# Patient Record
Sex: Male | Born: 1955 | Race: White | Hispanic: No | Marital: Single
Health system: Southern US, Community
[De-identification: ages and names within clinical notes are randomized; demographics above are authoritative.]

## PROBLEM LIST (undated history)

## (undated) DIAGNOSIS — E876 Hypokalemia: Secondary | ICD-10-CM

## (undated) DIAGNOSIS — G4733 Obstructive sleep apnea (adult) (pediatric): Secondary | ICD-10-CM

## (undated) DIAGNOSIS — I509 Heart failure, unspecified: Secondary | ICD-10-CM

## (undated) DIAGNOSIS — S72009A Fracture of unspecified part of neck of unspecified femur, initial encounter for closed fracture: Secondary | ICD-10-CM

## (undated) DIAGNOSIS — I4891 Unspecified atrial fibrillation: Secondary | ICD-10-CM

## (undated) DIAGNOSIS — E669 Obesity, unspecified: Secondary | ICD-10-CM

## (undated) DIAGNOSIS — E119 Type 2 diabetes mellitus without complications: Secondary | ICD-10-CM

## (undated) DIAGNOSIS — I10 Essential (primary) hypertension: Secondary | ICD-10-CM

## (undated) DIAGNOSIS — E785 Hyperlipidemia, unspecified: Secondary | ICD-10-CM

## (undated) DIAGNOSIS — K5900 Constipation, unspecified: Secondary | ICD-10-CM

## (undated) DIAGNOSIS — J9610 Chronic respiratory failure, unspecified whether with hypoxia or hypercapnia: Secondary | ICD-10-CM

## (undated) DIAGNOSIS — R569 Unspecified convulsions: Secondary | ICD-10-CM

## (undated) DIAGNOSIS — F308 Other manic episodes: Secondary | ICD-10-CM

## (undated) DIAGNOSIS — R609 Edema, unspecified: Secondary | ICD-10-CM

## (undated) DIAGNOSIS — F79 Unspecified intellectual disabilities: Secondary | ICD-10-CM

## (undated) DIAGNOSIS — K219 Gastro-esophageal reflux disease without esophagitis: Secondary | ICD-10-CM

## (undated) HISTORY — DX: Unspecified atrial fibrillation: I48.91

## (undated) HISTORY — DX: Gastro-esophageal reflux disease without esophagitis: K21.9

## (undated) HISTORY — DX: Hypokalemia: E87.6

## (undated) HISTORY — DX: Obstructive sleep apnea (adult) (pediatric): G47.33

## (undated) HISTORY — DX: Other manic episodes: F30.8

## (undated) HISTORY — DX: Chronic respiratory failure, unspecified whether with hypoxia or hypercapnia: J96.10

## (undated) HISTORY — DX: Essential (primary) hypertension: I10

## (undated) HISTORY — DX: Fracture of unspecified part of neck of unspecified femur, initial encounter for closed fracture: S72.009A

## (undated) HISTORY — DX: Hyperlipidemia, unspecified: E78.5

## (undated) HISTORY — DX: Heart failure, unspecified: I50.9

## (undated) HISTORY — DX: Obesity, unspecified: E66.9

## (undated) HISTORY — DX: Type 2 diabetes mellitus without complications: E11.9

## (undated) HISTORY — DX: Unspecified intellectual disabilities: F79

## (undated) HISTORY — DX: Constipation, unspecified: K59.00

## (undated) HISTORY — DX: Edema, unspecified: R60.9

## (undated) HISTORY — DX: Unspecified convulsions: R56.9

---

## 1998-04-08 ENCOUNTER — Emergency Department (HOSPITAL_COMMUNITY): Admission: EM | Admit: 1998-04-08 | Discharge: 1998-04-08 | Payer: Self-pay | Admitting: Emergency Medicine

## 1998-06-01 ENCOUNTER — Ambulatory Visit (HOSPITAL_COMMUNITY): Admission: RE | Admit: 1998-06-01 | Discharge: 1998-06-01 | Payer: Self-pay | Admitting: Gastroenterology

## 1998-10-01 ENCOUNTER — Emergency Department (HOSPITAL_COMMUNITY): Admission: EM | Admit: 1998-10-01 | Discharge: 1998-10-01 | Payer: Self-pay | Admitting: Emergency Medicine

## 1998-11-30 ENCOUNTER — Encounter: Payer: Self-pay | Admitting: Emergency Medicine

## 1998-11-30 ENCOUNTER — Emergency Department (HOSPITAL_COMMUNITY): Admission: EM | Admit: 1998-11-30 | Discharge: 1998-11-30 | Payer: Self-pay | Admitting: Emergency Medicine

## 1998-12-02 ENCOUNTER — Inpatient Hospital Stay (HOSPITAL_COMMUNITY): Admission: EM | Admit: 1998-12-02 | Discharge: 1998-12-08 | Payer: Self-pay | Admitting: Emergency Medicine

## 1998-12-02 ENCOUNTER — Encounter: Payer: Self-pay | Admitting: Emergency Medicine

## 1998-12-03 ENCOUNTER — Encounter: Payer: Self-pay | Admitting: Orthopedic Surgery

## 2004-02-07 ENCOUNTER — Emergency Department (HOSPITAL_COMMUNITY): Admission: EM | Admit: 2004-02-07 | Discharge: 2004-02-07 | Payer: Self-pay | Admitting: Emergency Medicine

## 2005-06-13 ENCOUNTER — Inpatient Hospital Stay (HOSPITAL_COMMUNITY): Admission: EM | Admit: 2005-06-13 | Discharge: 2005-06-23 | Payer: Self-pay | Admitting: Emergency Medicine

## 2005-06-13 ENCOUNTER — Ambulatory Visit: Payer: Self-pay | Admitting: Internal Medicine

## 2005-06-19 ENCOUNTER — Ambulatory Visit: Payer: Self-pay | Admitting: Pulmonary Disease

## 2013-01-07 ENCOUNTER — Non-Acute Institutional Stay (SKILLED_NURSING_FACILITY): Payer: Medicare Other | Admitting: Internal Medicine

## 2013-01-07 DIAGNOSIS — I4891 Unspecified atrial fibrillation: Secondary | ICD-10-CM

## 2013-01-07 DIAGNOSIS — E119 Type 2 diabetes mellitus without complications: Secondary | ICD-10-CM

## 2013-01-07 DIAGNOSIS — E870 Hyperosmolality and hypernatremia: Secondary | ICD-10-CM

## 2013-01-07 DIAGNOSIS — I509 Heart failure, unspecified: Secondary | ICD-10-CM

## 2013-01-08 NOTE — Progress Notes (Signed)
PROGRESS NOTE  DATE: 01-07-13  FACILITY: [facility]  LEVEL OF CARE: SNF  Routine Visit  CHIEF COMPLAINT:  Manage hypernatremia, diabetes mellitus and atrial fibrillation  HISTORY OF PRESENT ILLNESS:  REASSESSMENT OF ONGOING PROBLEM(S):  1. Hypernatremia-on 3/18 sodium 128, 3/28 sodium 129. His Lasix was discontinued. 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 Staff do not report any behavioral changes.  2.DM:pt's DM remains stable.  staff denies polyuria, polydipsia, polyphagia, changes in vision or hypoglycemic episodes.  No complications noted from the medication presently being used.  Last hemoglobin A1c is: hemoglobin A1c 6.2 in 11/13.  3. ATRIAL FIBRILLATION: the patients a-fib remains stable.  The staff denies DOE, tachycardia, orthopnea, transient neurological sx, pedal edema, palpitations, & PNDs.  No complications noted from the medications currently being used.  PAST MEDICAL HISTORY : Reviewed.  No changes.  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  PHYSICAL EXAMINATION  VS:  T 98.3       P 86      RR 18     BP 138/58     POX % 95     WT (Lb)  GENERAL: no acute distress, moderately obese body habitus EYES: conjunctivae normal, sclerae normal, normal eye lids NECK: supple, trachea midline, no neck masses, no thyroid tenderness, no thyromegaly LYMPHATICS: no LAN in the neck, no supraclavicular LAN RESPIRATORY: breathing is even & unlabored, BS CTAB CARDIAC: RRR, no murmur,no extra heart sounds, no edema GI: abdomen soft, normal BS, no masses, no tenderness, no hepatomegaly, no splenomegaly PSYCHIATRIC: the patient is alert and is disoriented, affect & behavior appropriate  LABS/RADIOLOGY: 3/14 sodium 129, glucose 145 otherwise BMP normal, platelets 149 otherwise CBC normal, Tegretol 11.1 11/13 CMP normal, fasting lipid panel normal  ASSESSMENT/PLAN:  1. Hypernatremia-new problem.  Reassess. 2. diabetes mellitus-well controlled. 3. atrial fibrillation-rate controlled. 4. CHF-well compensated. 5. seizure disorder-well-controlled. 6. anxiety-stable.  CPT CODE: 56213

## 2013-02-26 ENCOUNTER — Non-Acute Institutional Stay (SKILLED_NURSING_FACILITY): Payer: Medicare Other | Admitting: Internal Medicine

## 2013-02-26 DIAGNOSIS — E119 Type 2 diabetes mellitus without complications: Secondary | ICD-10-CM

## 2013-02-26 DIAGNOSIS — R569 Unspecified convulsions: Secondary | ICD-10-CM

## 2013-02-26 DIAGNOSIS — I4891 Unspecified atrial fibrillation: Secondary | ICD-10-CM

## 2013-02-26 DIAGNOSIS — I509 Heart failure, unspecified: Secondary | ICD-10-CM

## 2013-03-01 DIAGNOSIS — R569 Unspecified convulsions: Secondary | ICD-10-CM | POA: Insufficient documentation

## 2013-03-01 DIAGNOSIS — E119 Type 2 diabetes mellitus without complications: Secondary | ICD-10-CM | POA: Insufficient documentation

## 2013-03-01 DIAGNOSIS — I4891 Unspecified atrial fibrillation: Secondary | ICD-10-CM | POA: Insufficient documentation

## 2013-03-01 DIAGNOSIS — I509 Heart failure, unspecified: Secondary | ICD-10-CM | POA: Insufficient documentation

## 2013-03-01 NOTE — Progress Notes (Signed)
PROGRESS NOTE  DATE: 02-26-13  FACILITY: [facility]  LEVEL OF CARE: SNF  Routine Visit  CHIEF COMPLAINT:  Manage diabetes mellitus and atrial fibrillation  HISTORY OF PRESENT ILLNESS:  REASSESSMENT OF ONGOING PROBLEM(S):  1. DM:pt's DM remains stable.  staff deny polyuria, polydipsia, polyphagia, changes in vision or hypoglycemic episodes.  No complications noted from the medication presently being used.  Last hemoglobin A1c is: hemoglobin A1c 6.2 in 11/13.  2.  ATRIAL FIBRILLATION: the patients a-fib remains stable.  The staff deny DOE, tachycardia, orthopnea, transient neurological sx, pedal edema, palpitations, & PNDs.  No complications noted from the medications currently being used.  PAST MEDICAL HISTORY : Reviewed.  No changes.  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  PHYSICAL EXAMINATION  VS:  T 97.7      P 78      RR 18     BP 130/70     POX % 99     WT (Lb)  GENERAL: no acute distress, moderately obese body habitus NECK: supple, trachea midline, no neck masses, no thyroid tenderness, no thyromegaly RESPIRATORY: breathing is even & unlabored, BS CTAB CARDIAC: RRR, no murmur,no extra heart sounds, no edema GI: abdomen soft, normal BS, no masses, no tenderness, no hepatomegaly, no splenomegaly PSYCHIATRIC: the patient is alert and is disoriented, affect & behavior appropriate  LABS/RADIOLOGY: 3/14 sodium 129, glucose 145 otherwise BMP normal, platelets 149 otherwise CBC normal, Tegretol 11.1 11/13 CMP normal, fasting lipid panel normal  ASSESSMENT/PLAN:  1. diabetes mellitus-well controlled.  Check HbA1c. 2. atrial fibrillation-rate controlled. 3. CHF-well compensated. 4. seizure disorder-well-controlled. 5. anxiety-stable.  CPT CODE: 30865

## 2013-04-16 ENCOUNTER — Non-Acute Institutional Stay (SKILLED_NURSING_FACILITY): Payer: Medicare Other | Admitting: Adult Health

## 2013-04-16 DIAGNOSIS — I509 Heart failure, unspecified: Secondary | ICD-10-CM

## 2013-04-16 DIAGNOSIS — I10 Essential (primary) hypertension: Secondary | ICD-10-CM

## 2013-04-16 DIAGNOSIS — K5900 Constipation, unspecified: Secondary | ICD-10-CM

## 2013-04-16 DIAGNOSIS — K219 Gastro-esophageal reflux disease without esophagitis: Secondary | ICD-10-CM

## 2013-04-16 DIAGNOSIS — F419 Anxiety disorder, unspecified: Secondary | ICD-10-CM

## 2013-04-16 DIAGNOSIS — R569 Unspecified convulsions: Secondary | ICD-10-CM

## 2013-04-16 DIAGNOSIS — F411 Generalized anxiety disorder: Secondary | ICD-10-CM

## 2013-04-16 DIAGNOSIS — I4891 Unspecified atrial fibrillation: Secondary | ICD-10-CM

## 2013-04-16 DIAGNOSIS — E119 Type 2 diabetes mellitus without complications: Secondary | ICD-10-CM

## 2013-04-24 ENCOUNTER — Encounter: Payer: Self-pay | Admitting: Adult Health

## 2013-04-24 DIAGNOSIS — I10 Essential (primary) hypertension: Secondary | ICD-10-CM | POA: Insufficient documentation

## 2013-04-24 DIAGNOSIS — K5900 Constipation, unspecified: Secondary | ICD-10-CM | POA: Insufficient documentation

## 2013-04-24 DIAGNOSIS — R569 Unspecified convulsions: Secondary | ICD-10-CM | POA: Insufficient documentation

## 2013-04-24 DIAGNOSIS — F419 Anxiety disorder, unspecified: Secondary | ICD-10-CM | POA: Insufficient documentation

## 2013-04-24 DIAGNOSIS — K219 Gastro-esophageal reflux disease without esophagitis: Secondary | ICD-10-CM | POA: Insufficient documentation

## 2013-04-24 NOTE — Progress Notes (Signed)
Patient ID: Tony Terry, male   DOB: 12-30-1955, 57 y.o.   MRN: [id number]       PROGRESS NOTE  DATE: 04/16/13  FACILITY: [facility]  LEVEL OF CARE: SNF  Routine Visit  CHIEF COMPLAINT:  Manage diabetes mellitus and atrial fibrillation  HISTORY OF PRESENT ILLNESS:   REASSESSMENT OF ONGOING PROBLEM(S):  1. DM:pt's DM remains stable.  staff denies polyuria, polydipsia, polyphagia, nor changes in vision.  Noted CBGs in a.m. to be less than 100.  Last hemoglobin A1c is:  6.2 in 11/13.  2.  ATRIAL FIBRILLATION: the patients a-fib remains stable.  The staff deny DOE, tachycardia, orthopnea, transient neurological sx, pedal edema, palpitations, & PNDs.  No complications noted from the medications currently being used.  PAST MEDICAL HISTORY : Reviewed.  No changes.  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  PHYSICAL EXAMINATION  VS:  T 97.8      P 78      RR 20     BP 137/77          WT (Lb) 260.8 pounds  GENERAL: no acute distress, moderately obese body habitus NECK: supple, trachea midline, no neck masses, no thyroid tenderness, no thyromegaly RESPIRATORY: breathing is even & unlabored, BS CTAB CARDIAC: RRR, no murmur,no extra heart sounds, no edema GI: abdomen soft, normal BS, no masses, no tenderness, no hepatomegaly, no splenomegaly PSYCHIATRIC: the patient is alert and is disoriented, affect & behavior appropriate  LABS/RADIOLOGY: 03/25/13 sodium 137 potassium 4.4 glucose 95 BUN 36 creatinine 1.60 total protein 6.3 albumin 2.5 calcium 9.0 03/05/13 sodium 1:30 potassium 4.5 glucose 78 BUN 7 creatinine 0.4 total protein 7.2 albumin 3.3 culture 8.9 liver profile normal hemoglobin A1c 6.3 Tegretol 9.4 primidone 6 02/27/13 liver profile normal except albumin 2.4 3/14 sodium 129, glucose 145 otherwise BMP normal, platelets 149 otherwise CBC normal,  Tegretol 11.1 11/13 CMP normal, fasting lipid panel  normal  ASSESSMENT/PLAN:  Seizures - stable  Constipation - stable  GERD (gastroesophageal reflux disease) - stable  Anxiety - stable  Hypertension - well controlled  Type II or unspecified type diabetes mellitus without mention of complication, not stated as uncontrolled Decrease Lantus to 70 units subcutaneous each bedtime  Atrial fibrillation - rate controlled  Congestive heart failure, unspecified  - stable   CPT CODE: 57846

## 2013-05-28 ENCOUNTER — Non-Acute Institutional Stay (SKILLED_NURSING_FACILITY): Payer: Medicare Other | Admitting: Internal Medicine

## 2013-05-28 DIAGNOSIS — E119 Type 2 diabetes mellitus without complications: Secondary | ICD-10-CM

## 2013-05-28 DIAGNOSIS — R569 Unspecified convulsions: Secondary | ICD-10-CM

## 2013-05-28 DIAGNOSIS — I509 Heart failure, unspecified: Secondary | ICD-10-CM

## 2013-05-28 DIAGNOSIS — I4891 Unspecified atrial fibrillation: Secondary | ICD-10-CM

## 2013-05-30 NOTE — Progress Notes (Signed)
PROGRESS NOTE  DATE: 05-28-13  FACILITY: [facility]  LEVEL OF CARE: SNF  Routine Visit  CHIEF COMPLAINT:  Manage diabetes mellitus and atrial fibrillation  HISTORY OF PRESENT ILLNESS:  REASSESSMENT OF ONGOING PROBLEM(S):  DM:pt's DM remains stable.  staff deny polyuria, polydipsia, polyphagia, changes in vision or hypoglycemic episodes.  No complications noted from the medication presently being used.  Last hemoglobin A1c is: hemoglobin A1c 6.2 in 11/13, in  8-14 hemoglobin A1c 6.2  ATRIAL FIBRILLATION: the patients a-fib remains stable.  The staff deny DOE, tachycardia, orthopnea, transient neurological sx, pedal edema, palpitations, & PNDs.  No complications noted from the medications currently being used.  PAST MEDICAL HISTORY : Reviewed.  No changes.  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  PHYSICAL EXAMINATION  VS:  T 98.3      P 63      RR 20     BP 116/67     POX % 99     WT (Lb)  GENERAL: no acute distress, moderately obese body habitus NECK: supple, trachea midline, no neck masses, no thyroid tenderness, no thyromegaly RESPIRATORY: breathing is even & unlabored, BS CTAB CARDIAC: RRR, no murmur,no extra heart sounds, no edema GI: abdomen soft, normal BS, no masses, no tenderness, no hepatomegaly, no splenomegaly PSYCHIATRIC: the patient is alert and is disoriented, affect & behavior appropriate  LABS/RADIOLOGY:  6/14 BUN 36, creatinine 1.6 otherwise CMP normal 5/14 albumin 3.4 otherwise liver profile normal, fasting lipid panel normal, Tegretol level 9.4, phenobarbital level 7.1 3/14 sodium 129, glucose 145 otherwise BMP normal, platelets 149 otherwise CBC normal, Tegretol 11.1 11/13 CMP normal, fasting lipid panel normal  ASSESSMENT/PLAN:  diabetes mellitus-well controlled. Lantus was decreased atrial fibrillation-rate controlled. CHF-well compensated. seizure  disorder-well-controlled. anxiety-stable.  CPT CODE: [id number]

## 2013-06-16 ENCOUNTER — Other Ambulatory Visit: Payer: Self-pay | Admitting: Internal Medicine

## 2013-06-16 ENCOUNTER — Ambulatory Visit (HOSPITAL_COMMUNITY): Payer: Medicare Other

## 2013-06-16 DIAGNOSIS — M549 Dorsalgia, unspecified: Secondary | ICD-10-CM

## 2013-06-17 ENCOUNTER — Ambulatory Visit (HOSPITAL_COMMUNITY)
Admission: RE | Admit: 2013-06-17 | Discharge: 2013-06-17 | Disposition: A | Discharge status: Home / Self Care | Payer: Medicare Other | Source: Ambulatory Visit | Attending: Internal Medicine | Admitting: Internal Medicine

## 2013-06-17 ENCOUNTER — Non-Acute Institutional Stay (SKILLED_NURSING_FACILITY): Payer: Medicare Other | Admitting: Adult Health

## 2013-06-17 DIAGNOSIS — R52 Pain, unspecified: Secondary | ICD-10-CM

## 2013-06-17 DIAGNOSIS — F79 Unspecified intellectual disabilities: Secondary | ICD-10-CM

## 2013-06-17 DIAGNOSIS — R51 Headache: Secondary | ICD-10-CM | POA: Insufficient documentation

## 2013-06-18 ENCOUNTER — Other Ambulatory Visit: Payer: Self-pay | Admitting: Internal Medicine

## 2013-06-18 DIAGNOSIS — M549 Dorsalgia, unspecified: Secondary | ICD-10-CM

## 2013-06-18 DIAGNOSIS — M542 Cervicalgia: Secondary | ICD-10-CM

## 2013-06-19 ENCOUNTER — Ambulatory Visit (HOSPITAL_COMMUNITY)
Admission: RE | Admit: 2013-06-19 | Discharge: 2013-06-19 | Disposition: A | Discharge status: Home / Self Care | Payer: Medicare Other | Source: Ambulatory Visit | Attending: Internal Medicine | Admitting: Internal Medicine

## 2013-06-19 ENCOUNTER — Non-Acute Institutional Stay (SKILLED_NURSING_FACILITY): Payer: Medicare Other | Admitting: Internal Medicine

## 2013-06-19 DIAGNOSIS — I4891 Unspecified atrial fibrillation: Secondary | ICD-10-CM

## 2013-06-19 DIAGNOSIS — M549 Dorsalgia, unspecified: Secondary | ICD-10-CM

## 2013-06-19 DIAGNOSIS — S22009A Unspecified fracture of unspecified thoracic vertebra, initial encounter for closed fracture: Secondary | ICD-10-CM | POA: Insufficient documentation

## 2013-06-19 DIAGNOSIS — M545 Low back pain, unspecified: Secondary | ICD-10-CM

## 2013-06-19 DIAGNOSIS — I10 Essential (primary) hypertension: Secondary | ICD-10-CM

## 2013-06-19 DIAGNOSIS — M542 Cervicalgia: Secondary | ICD-10-CM

## 2013-06-19 DIAGNOSIS — E119 Type 2 diabetes mellitus without complications: Secondary | ICD-10-CM

## 2013-06-19 DIAGNOSIS — W19XXXA Unspecified fall, initial encounter: Secondary | ICD-10-CM | POA: Insufficient documentation

## 2013-06-19 NOTE — Progress Notes (Signed)
PROGRESS NOTE  DATE: 06-19-13  FACILITY: [facility]  LEVEL OF CARE: SNF  Routine Visit  CHIEF COMPLAINT:  Manage, HTN, diabetes mellitus and atrial fibrillation  HISTORY OF PRESENT ILLNESS:  REASSESSMENT OF ONGOING PROBLEM(S):  DM:pt's DM remains stable.  staff deny polyuria, polydipsia, polyphagia, changes in vision or hypoglycemic episodes.  No complications noted from the medication presently being used.  Last hemoglobin A1c is: hemoglobin A1c 6.2 in 11/13, in  8-14 hemoglobin A1c 6.2  ATRIAL FIBRILLATION: the patients a-fib remains stable.  The staff deny DOE, tachycardia, orthopnea, transient neurological sx, pedal edema, palpitations, & PNDs.  No complications noted from the medications currently being used.  HTN: Pt 's HTN remains stable.  Denies CP, sob, DOE, pedal edema, headaches, dizziness or visual disturbances.  No complications from the medications currently being used.  Last BP : 144/80, 164/85, 147/82  PAST MEDICAL HISTORY : Reviewed.  No changes.  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  PHYSICAL EXAMINATION  VS:  T 97.9      P 76      RR 20     BP 144/80     POX %    WT (Lb)  GENERAL: no acute distress, moderately obese body habitus EYES: Normal sclerae, normal conjunctivae, no discharge NECK: supple, trachea midline, no neck masses, no thyroid tenderness, no thyromegaly LYMPHATICS: No cervical lymphadenopathy, no supraclavicular lymphadenopathy RESPIRATORY: breathing is even & unlabored, BS CTAB CARDIAC: RRR, no murmur,no extra heart sounds, no edema GI: abdomen soft, normal BS, no masses, no tenderness, no hepatomegaly, no splenomegaly PSYCHIATRIC: the patient is alert and is disoriented, affect & behavior appropriate  LABS/RADIOLOGY:  05-31-13 LS-spine x-ray negative  6/14 BUN 36, creatinine 1.6 otherwise CMP normal 5/14 albumin 3.4 otherwise liver profile normal, fasting lipid  panel normal, Tegretol level 9.4, phenobarbital level 7.1 3/14 sodium 129, glucose 145 otherwise BMP normal, platelets 149 otherwise CBC normal, Tegretol 11.1 11/13 CMP normal, fasting lipid panel normal  ASSESSMENT/PLAN:  Hypertension-uncontrolled. Increase Lopressor to 75 mg twice a day. diabetes mellitus-well controlled. Lantus was decreased atrial fibrillation-rate controlled. Low back pain-Percocet was started. CT of the lumbar spine is pending CHF-well compensated. seizure disorder-well-controlled. anxiety-stable.  CPT CODE: [id number]

## 2013-06-20 ENCOUNTER — Encounter: Payer: Self-pay | Admitting: Adult Health

## 2013-06-20 DIAGNOSIS — F79 Unspecified intellectual disabilities: Secondary | ICD-10-CM | POA: Insufficient documentation

## 2013-06-20 DIAGNOSIS — R52 Pain, unspecified: Secondary | ICD-10-CM | POA: Insufficient documentation

## 2013-06-20 NOTE — Progress Notes (Signed)
Patient ID: Tony Terry, male   DOB: 1955-10-25, 57 y.o.   MRN: [id number]       PROGRESS NOTE  DATE: 06/17/2013  FACILITY:  [facility]  LEVEL OF CARE: SNF (31)  Acute Visit  CHIEF COMPLAINT:  Manage Pain on neck and lower back  HISTORY OF PRESENT ILLNESS: This is a 57 year old male who had a fall previously and now complaining of pain on his neck and lower back. No bruises noted x-ray of spine shows no fracture. Patient is unable to sit up for a long period of time.  PAST MEDICAL HISTORY : Reviewed.  No changes.  CURRENT MEDICATIONS: Reviewed per [facility]  REVIEW OF SYSTEMS:  GENERAL: no change in appetite, no fatigue, no weight changes, no fever, chills or weakness RESPIRATORY: no cough, SOB, DOE,, wheezing, hemoptysis CARDIAC: no chest pain, edema or palpitations GI: no abdominal pain, diarrhea, constipation, heart burn, nausea or vomiting  PHYSICAL EXAMINATION  VS:  T 97        P 74       RR 18       BP 126/78           WT 255 (Lb)  GENERAL: no acute distress, normal body habitus EYES: conjunctivae normal, sclerae normal, normal eye lids NECK: supple, trachea midline, no neck masses, no thyroid tenderness, no thyromegaly LYMPHATICS: no LAN in the neck, no supraclavicular LAN RESPIRATORY: breathing is even & unlabored, BS CTAB CARDIAC: RRR, no murmur,no extra heart sounds, no edema GI: abdomen soft, normal BS, no masses, no tenderness, no hepatomegaly, no splenomegaly PSYCHIATRIC: the patient is alert & oriented to person, affect & behavior appropriate  LABS/RADIOLOGY: 6/14 BUN 36, creatinine 1.6 otherwise CMP normal 5/14 albumin 3.4 otherwise liver profile normal, fasting lipid panel normal, Tegretol level 9.4, phenobarbital level 7.1 3/14 sodium 129, glucose 145 otherwise BMP normal, platelets 149 otherwise CBC normal, Tegretol 11.1 11/13 CMP normal, fasting lipid panel normal   ASSESSMENT/PLAN:  Intellect Disability - continue supportive care  Pain  - CT scan of spine, cervical, thoracic and sacral  CPT CODE: 40981

## 2013-07-30 ENCOUNTER — Non-Acute Institutional Stay (SKILLED_NURSING_FACILITY): Payer: Medicare Other | Admitting: Adult Health

## 2013-07-30 DIAGNOSIS — K219 Gastro-esophageal reflux disease without esophagitis: Secondary | ICD-10-CM

## 2013-07-30 DIAGNOSIS — F411 Generalized anxiety disorder: Secondary | ICD-10-CM

## 2013-07-30 DIAGNOSIS — F419 Anxiety disorder, unspecified: Secondary | ICD-10-CM

## 2013-07-30 DIAGNOSIS — F79 Unspecified intellectual disabilities: Secondary | ICD-10-CM

## 2013-07-30 DIAGNOSIS — I10 Essential (primary) hypertension: Secondary | ICD-10-CM

## 2013-07-30 DIAGNOSIS — K5900 Constipation, unspecified: Secondary | ICD-10-CM

## 2013-07-30 DIAGNOSIS — E119 Type 2 diabetes mellitus without complications: Secondary | ICD-10-CM

## 2013-07-30 DIAGNOSIS — R569 Unspecified convulsions: Secondary | ICD-10-CM

## 2013-07-30 DIAGNOSIS — I4891 Unspecified atrial fibrillation: Secondary | ICD-10-CM

## 2013-07-30 DIAGNOSIS — I509 Heart failure, unspecified: Secondary | ICD-10-CM

## 2013-07-30 NOTE — Progress Notes (Signed)
Patient ID: Tony Terry, male   DOB: 11-Dec-1955, 57 y.o.   MRN: [id number]       PROGRESS NOTE  DATE: 07/30/13  FACILITY: [facility]  LEVEL OF CARE: SNF (31)   Routine Visit  CHIEF COMPLAINT:  Manage, HTN, CHF,diabetes mellitus and atrial fibrillation  HISTORY OF PRESENT ILLNESS:  REASSESSMENT OF ONGOING PROBLEM(S):  CHF:The patient does not relate significant weight changes, denies sob, DOE, orthopnea, PNDs, pedal edema, palpitations or chest pain.  CHF remains stable.  No complications form the medications being used.  ANXIETY: The anxiety remains stable. Patient denies ongoing anxiety or irritability. No complications reported from the medications currently being used.  HTN: Pt 's HTN remains stable.  Denies CP, sob, DOE, pedal edema, headaches, dizziness or visual disturbances.  No complications from the medications currently being used.  Last BP : 120/78   PAST MEDICAL HISTORY : Reviewed.  No changes.  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  PHYSICAL EXAMINATION  VS:  T 97.9      P 76      RR 20     BP 120/78     POX100 %    WT254.4 (Lb)  GENERAL: no acute distress, moderately obese body habitus NECK: supple, trachea midline, no neck masses, no thyroid tenderness, no thyromegaly RESPIRATORY: breathing is even & unlabored, BS CTAB CARDIAC: RRR, no murmur,no extra heart sounds, no edema GI: abdomen soft, normal BS, no masses, no tenderness, no hepatomegaly, no splenomegaly PSYCHIATRIC: the patient is alert and is disoriented, affect & behavior appropriate  LABS/RADIOLOGY: 9/14 WBC 6.3 hemoglobin 15 hematocrit 47.3  CT thoracic spine shows T12 compression fracture 05-31-13 LS-spine x-ray negative 6/14 BUN 36, creatinine 1.6 otherwise CMP normal 5/14 albumin 3.4 otherwise liver profile normal, fasting lipid panel normal, Tegretol level 9.4, phenobarbital level 7.1 3/14 sodium 129, glucose 145 otherwise  BMP normal, platelets 149 otherwise CBC normal,  Tegretol 11.1 11/13 CMP normal, fasting lipid panel normal  ASSESSMENT/PLAN:  Hypertension-  Well-controlled . diabetes mellitus- well controlled. Continue Lantusatrial fibrillation-rate controlled.  CHF-well compensated.  seizure disorder-well-controlled.  anxiety-stable.  Intellect Disability - continue supportive care   CPT CODE: 40981

## 2013-09-04 ENCOUNTER — Non-Acute Institutional Stay (SKILLED_NURSING_FACILITY): Payer: Medicare Other | Admitting: Adult Health

## 2013-09-04 DIAGNOSIS — F411 Generalized anxiety disorder: Secondary | ICD-10-CM

## 2013-09-04 DIAGNOSIS — F79 Unspecified intellectual disabilities: Secondary | ICD-10-CM

## 2013-09-04 DIAGNOSIS — E119 Type 2 diabetes mellitus without complications: Secondary | ICD-10-CM

## 2013-09-04 DIAGNOSIS — F419 Anxiety disorder, unspecified: Secondary | ICD-10-CM

## 2013-09-04 DIAGNOSIS — K5900 Constipation, unspecified: Secondary | ICD-10-CM

## 2013-09-04 DIAGNOSIS — K219 Gastro-esophageal reflux disease without esophagitis: Secondary | ICD-10-CM

## 2013-09-04 DIAGNOSIS — I4891 Unspecified atrial fibrillation: Secondary | ICD-10-CM

## 2013-09-04 DIAGNOSIS — I509 Heart failure, unspecified: Secondary | ICD-10-CM

## 2013-09-04 DIAGNOSIS — I10 Essential (primary) hypertension: Secondary | ICD-10-CM

## 2013-09-04 DIAGNOSIS — R569 Unspecified convulsions: Secondary | ICD-10-CM

## 2013-09-04 NOTE — Progress Notes (Signed)
Patient ID: Tony Terry, male   DOB: August 01, 1956, 57 y.o.   MRN: [id number]       PROGRESS NOTE  DATE: 09/04/13  FACILITY: [facility]  LEVEL OF CARE: SNF (31)   Routine Visit  CHIEF COMPLAINT:  Manage, HTN, CHF,diabetes mellitus and atrial fibrillation  HISTORY OF PRESENT ILLNESS:  REASSESSMENT OF ONGOING PROBLEM(S):  DM:pt's DM remains stable.  Pt denies polyuria, polydipsia, polyphagia, changes in vision or hypoglycemic episodes.  No complications noted from the medication presently being used. 11/14  hemoglobin A1c is: 5.9  ATRIAL FIBRILLATION: the patients atrial fibrillation remains stable.  The patient denies DOE, tachycardia, orthopnea, transient neurological sx, pedal edema, palpitations, & PNDs.  No complications noted from the medications currently being used.  HTN: Pt 's HTN remains stable.  Denies CP, sob, DOE, pedal edema, headaches, dizziness or visual disturbances.  No complications from the medications currently being used.  Last BP : 120/78  GERD: pt's GERD is stable.  Denies ongoing heartburn, abd. Pain, nausea or vomiting.  Currently on a PPI & tolerates it without any adverse reactions.  PAST MEDICAL HISTORY : Reviewed.  No changes.  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  PHYSICAL EXAMINATION  VS:  T 98.7      P64     RR 20     BP 120/78     POX100 %    WT256.8 (Lb)  GENERAL: no acute distress, moderately obese body habitus EYES:  Normal sclerae, normal conjunctivae, no discharge NECK: supple, trachea midline, no neck masses, no thyroid tenderness, no thyromegaly RESPIRATORY: breathing is even & unlabored, BS CTAB CARDIAC: RRR, no murmur,no extra heart sounds, no edema GI: abdomen soft, normal BS, no masses, no tenderness, no hepatomegaly, no splenomegaly PSYCHIATRIC: the patient is alert and is disoriented, affect & behavior appropriate  LABS/RADIOLOGY: 9/14 WBC 6.3  hemoglobin 15 hematocrit 47.3  CT thoracic spine shows T12 compression fracture 05-31-13 LS-spine x-ray negative 6/14 BUN 36, creatinine 1.6 otherwise CMP normal 5/14 albumin 3.4 otherwise liver profile normal, fasting lipid panel normal, Tegretol level 9.4, phenobarbital level 7.1 3/14 sodium 129, glucose 145 otherwise BMP normal, platelets 149 otherwise CBC normal,  Tegretol 11.1 11/13 CMP normal, fasting lipid panel normal  ASSESSMENT/PLAN:  Hypertension-  Well-controlled . diabetes mellitus- well controlled. Continue Lantusatrial fibrillation-rate controlled.  CHF-well compensated.  seizure disorder-well-controlled.  anxiety-stable.  Intellect Disability - continue supportive care  Constipation - no complaints  GERD - continue Zantac   CPT CODE: 56213

## 2013-10-01 ENCOUNTER — Non-Acute Institutional Stay (SKILLED_NURSING_FACILITY): Payer: Medicare Other | Admitting: Adult Health

## 2013-10-01 DIAGNOSIS — F419 Anxiety disorder, unspecified: Secondary | ICD-10-CM

## 2013-10-01 DIAGNOSIS — E119 Type 2 diabetes mellitus without complications: Secondary | ICD-10-CM

## 2013-10-01 DIAGNOSIS — F411 Generalized anxiety disorder: Secondary | ICD-10-CM

## 2013-10-01 DIAGNOSIS — F79 Unspecified intellectual disabilities: Secondary | ICD-10-CM

## 2013-10-01 DIAGNOSIS — I4891 Unspecified atrial fibrillation: Secondary | ICD-10-CM

## 2013-10-01 DIAGNOSIS — K5900 Constipation, unspecified: Secondary | ICD-10-CM

## 2013-10-01 DIAGNOSIS — I10 Essential (primary) hypertension: Secondary | ICD-10-CM

## 2013-10-01 DIAGNOSIS — I509 Heart failure, unspecified: Secondary | ICD-10-CM

## 2013-10-01 DIAGNOSIS — K219 Gastro-esophageal reflux disease without esophagitis: Secondary | ICD-10-CM

## 2013-10-01 DIAGNOSIS — R569 Unspecified convulsions: Secondary | ICD-10-CM

## 2013-10-01 NOTE — Progress Notes (Signed)
Patient ID: Tony Terry, male   DOB: 01/10/1956, 57 y.o.   MRN: [id number]                 PROGRESS NOTE  DATE: 10/01/13  FACILITY: [facility]  LEVEL OF CARE: SNF (31)   Routine Visit  CHIEF COMPLAINT:  Manage, HTN, CHF,diabetes mellitus and atrial fibrillation  HISTORY OF PRESENT ILLNESS:  REASSESSMENT OF ONGOING PROBLEM(S):  SEIZURE DISORDER: The patient's seizure disorder remains stable. No complications reported from the medications presently being used. Staff do not report any recent seizure activity.  HTN: Pt 's HTN remains stable.  Denies CP, sob, DOE, pedal edema, headaches, dizziness or visual disturbances.  No complications from the medications currently being used.  Last BP : 103/61  CHF:The patient does not relate significant weight changes, denies sob, DOE, orthopnea, PNDs, pedal edema, palpitations or chest pain.  CHF remains stable.  No complications form the medications being used.   PAST MEDICAL HISTORY : Reviewed.  No changes.  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  PHYSICAL EXAMINATION  VS:  T 98.8      P56     RR 20     BP 103/61        WT257.2 (Lb)  GENERAL: no acute distress, moderately obese body habitus NECK: supple, trachea midline, no neck masses, no thyroid tenderness, no thyromegaly RESPIRATORY: breathing is even & unlabored, BS CTAB CARDIAC: RRR, no murmur,no extra heart sounds, no edema GI: abdomen soft, normal BS, no masses, no tenderness, no hepatomegaly, no splenomegaly PSYCHIATRIC: the patient is alert and is disoriented, affect & behavior appropriate  LABS/RADIOLOGY: 09/23/13 sodium 131 potassium 4.1 glucose 84 BUN 8 creatinine 0.5 calcium 9.2 09/03/13 hemoglobin A1c 5.8 cholesterol 139 HDL 43 LDL 81 triglycerides 75 WBC 5.3 hemoglobin 13.8 hematocrit 42.9 sodium 128 potassium 4.1 glucose 163 BUN 9 creatinine 0.5 calcium 8.4 08/25/13 hemoglobin A1c 5.9  sodium 128 potassium 4.2 glucose 118 BUN 8 creatinine 0.5 calcium 9.2 cholesterol 143 HDL 50 LDL 84 triglycerides 44 9/14 WBC 6.3 hemoglobin 15 hematocrit 47.3  CT thoracic spine shows T12 compression fracture 05-31-13 LS-spine x-ray negative 6/14 BUN 36, creatinine 1.6 otherwise CMP normal 5/14 albumin 3.4 otherwise liver profile normal, fasting lipid panel normal, Tegretol level 9.4, phenobarbital level 7.1 3/14 sodium 129, glucose 145 otherwise BMP normal, platelets 149 otherwise CBC normal,  Tegretol 11.1 11/13 CMP normal, fasting lipid panel normal  ASSESSMENT/PLAN:  Hypertension-  Well-controlled; continue diltiazem  . diabetes mellitus- well controlled. Continue Lantus, NovoLog sliding scale and Actos  atrial fibrillation-rate controlled; continue Lopressor  CHF-well compensated.  seizure disorder-well-controlled; continue Tegretol  anxiety-stable; continue BuSpar  Intellect Disability - continue supportive care  Constipation - no complaints; continue senna S.  GERD - continue Zantac   CPT CODE: [id number]

## 2013-11-07 ENCOUNTER — Non-Acute Institutional Stay (SKILLED_NURSING_FACILITY): Payer: Medicare Other | Admitting: Adult Health

## 2013-11-07 DIAGNOSIS — F79 Unspecified intellectual disabilities: Secondary | ICD-10-CM

## 2013-11-07 DIAGNOSIS — K219 Gastro-esophageal reflux disease without esophagitis: Secondary | ICD-10-CM

## 2013-11-07 DIAGNOSIS — K5900 Constipation, unspecified: Secondary | ICD-10-CM

## 2013-11-07 DIAGNOSIS — I4891 Unspecified atrial fibrillation: Secondary | ICD-10-CM

## 2013-11-07 DIAGNOSIS — E119 Type 2 diabetes mellitus without complications: Secondary | ICD-10-CM

## 2013-11-07 DIAGNOSIS — R569 Unspecified convulsions: Secondary | ICD-10-CM

## 2013-11-07 DIAGNOSIS — I509 Heart failure, unspecified: Secondary | ICD-10-CM

## 2013-11-07 DIAGNOSIS — F419 Anxiety disorder, unspecified: Secondary | ICD-10-CM

## 2013-11-07 DIAGNOSIS — I10 Essential (primary) hypertension: Secondary | ICD-10-CM

## 2013-11-07 DIAGNOSIS — F411 Generalized anxiety disorder: Secondary | ICD-10-CM

## 2013-11-08 NOTE — Progress Notes (Signed)
Patient ID: Tony CoriaMark Terry, male   DOB: 04-15-1956, 58 y.o.   MRN: [id number]                  PROGRESS NOTE  DATE: 11/07/13  [facility]  LEVEL OF CARE: SNF (31)   Routine Visit  CHIEF COMPLAINT:  Manage, HTN, CHF,diabetes mellitus and atrial fibrillation  HISTORY OF PRESENT ILLNESS:  REASSESSMENT OF ONGOING PROBLEM(S):  DM:pt's DM remains stable.  Pt denies polyuria, polydipsia, polyphagia, changes in vision or hypoglycemic episodes.  No complications noted from the medication presently being used.  11/14 hemoglobin A1c5.8  HTN: Pt 's HTN remains stable.  Denies CP, sob, DOE, pedal edema, headaches, dizziness or visual disturbances.  No complications from the medications currently being used.  Last BP : 126/67  ATRIAL FIBRILLATION: the patients atrial fibrillation remains stable.  The patient denies DOE, tachycardia, orthopnea, transient neurological sx, pedal edema, palpitations, & PNDs.  No complications noted from the medications currently being used.   PAST MEDICAL HISTORY : Reviewed.  No changes.  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  PHYSICAL EXAMINATION  VS:  T98     P62    RR 20     BP 126/67        WT258.4 (Lb)  GENERAL: no acute distress, moderately obese body habitus NECK: supple, trachea midline, no neck masses, no thyroid tenderness, no thyromegaly LYMPHATICS: No adenopathy in the cervical, supraclavicular, axillary, or inguinal areas RESPIRATORY: breathing is even & unlabored, BS CTAB CARDIAC: RRR, no murmur,no extra heart sounds, no edema GI: abdomen soft, normal BS, no masses, no tenderness, no hepatomegaly, no splenomegaly PSYCHIATRIC: the patient is alert and is disoriented, affect & behavior appropriate  LABS/RADIOLOGY: 09/23/13 sodium 131 potassium 4.1 glucose 84 BUN 8 creatinine 0.5 calcium 9.2 09/03/13 hemoglobin A1c 5.8 cholesterol 139 HDL 43 LDL 81  triglycerides 75 WBC 5.3 hemoglobin 13.8 hematocrit 42.9 sodium 128 potassium 4.1 glucose 163 BUN 9 creatinine 0.5 calcium 8.4 08/25/13 hemoglobin A1c 5.9 sodium 128 potassium 4.2 glucose 118 BUN 8 creatinine 0.5 calcium 9.2 cholesterol 143 HDL 50 LDL 84 triglycerides 44 9/14 WBC 6.3 hemoglobin 15 hematocrit 47.3  CT thoracic spine shows T12 compression fracture 05-31-13 LS-spine x-ray negative 6/14 BUN 36, creatinine 1.6 otherwise CMP normal 5/14 albumin 3.4 otherwise liver profile normal, fasting lipid panel normal, Tegretol level 9.4, phenobarbital level 7.1 3/14 sodium 129, glucose 145 otherwise BMP normal, platelets 149 otherwise CBC normal,  Tegretol 11.1 11/13 CMP normal, fasting lipid panel normal  ASSESSMENT/PLAN:  Hypertension-  Well-controlled; continue diltiazem  . diabetes mellitus- well controlled. Continue Lantus, NovoLog sliding scale and Actos  atrial fibrillation-rate controlled; continue Lopressor  CHF-well compensated.  seizure disorder-well-controlled; continue Tegretol  anxiety-stable; continue BuSpar  Intellect Disability - continue supportive care  Constipation - no complaints; continue senna S.  GERD - continue Zantac   CPT CODE: [id number]

## 2013-12-09 ENCOUNTER — Encounter: Payer: Self-pay | Admitting: *Deleted

## 2013-12-10 ENCOUNTER — Non-Acute Institutional Stay (SKILLED_NURSING_FACILITY): Payer: Medicare Other | Admitting: Adult Health

## 2013-12-10 DIAGNOSIS — R569 Unspecified convulsions: Secondary | ICD-10-CM

## 2013-12-10 DIAGNOSIS — F411 Generalized anxiety disorder: Secondary | ICD-10-CM

## 2013-12-10 DIAGNOSIS — I509 Heart failure, unspecified: Secondary | ICD-10-CM

## 2013-12-10 DIAGNOSIS — K219 Gastro-esophageal reflux disease without esophagitis: Secondary | ICD-10-CM

## 2013-12-10 DIAGNOSIS — E119 Type 2 diabetes mellitus without complications: Secondary | ICD-10-CM

## 2013-12-10 DIAGNOSIS — I10 Essential (primary) hypertension: Secondary | ICD-10-CM

## 2013-12-10 DIAGNOSIS — K5900 Constipation, unspecified: Secondary | ICD-10-CM

## 2013-12-10 DIAGNOSIS — F419 Anxiety disorder, unspecified: Secondary | ICD-10-CM

## 2013-12-10 DIAGNOSIS — I4891 Unspecified atrial fibrillation: Secondary | ICD-10-CM

## 2013-12-10 DIAGNOSIS — F79 Unspecified intellectual disabilities: Secondary | ICD-10-CM

## 2014-01-28 ENCOUNTER — Encounter: Payer: Self-pay | Admitting: Adult Health

## 2014-01-28 ENCOUNTER — Non-Acute Institutional Stay (SKILLED_NURSING_FACILITY): Payer: Medicare Other | Admitting: Adult Health

## 2014-01-28 DIAGNOSIS — I4891 Unspecified atrial fibrillation: Secondary | ICD-10-CM

## 2014-01-28 DIAGNOSIS — F411 Generalized anxiety disorder: Secondary | ICD-10-CM

## 2014-01-28 DIAGNOSIS — K219 Gastro-esophageal reflux disease without esophagitis: Secondary | ICD-10-CM

## 2014-01-28 DIAGNOSIS — F79 Unspecified intellectual disabilities: Secondary | ICD-10-CM

## 2014-01-28 DIAGNOSIS — F419 Anxiety disorder, unspecified: Secondary | ICD-10-CM

## 2014-01-28 DIAGNOSIS — R569 Unspecified convulsions: Secondary | ICD-10-CM

## 2014-01-28 DIAGNOSIS — K5900 Constipation, unspecified: Secondary | ICD-10-CM

## 2014-01-28 DIAGNOSIS — I509 Heart failure, unspecified: Secondary | ICD-10-CM

## 2014-01-28 DIAGNOSIS — I10 Essential (primary) hypertension: Secondary | ICD-10-CM

## 2014-01-28 DIAGNOSIS — E119 Type 2 diabetes mellitus without complications: Secondary | ICD-10-CM

## 2014-01-28 NOTE — Progress Notes (Signed)
Patient ID: Carlene CoriaMark Muhlbauer, male   DOB: [birth year], 58 y.o.   MRN: [id number]         PROGRESS NOTE  DATE: 01/28/14  [facility]  LEVEL OF CARE: SNF (31)   Routine Visit  CHIEF COMPLAINT:  Manage, HTN, CHF,diabetes mellitus and atrial fibrillation  HISTORY OF PRESENT ILLNESS:  REASSESSMENT OF ONGOING PROBLEM(S):  ATRIAL FIBRILLATION: the patients atrial fibrillation remains stable.  The patient denies DOE, tachycardia, orthopnea, transient neurological sx, pedal edema, palpitations, & PNDs.  No complications noted from the medications currently being used.  CHF:The patient does not relate significant weight changes, denies sob, DOE, orthopnea, PNDs, pedal edema, palpitations or chest pain.  CHF remains stable.  No complications form the medications being used.  HTN: Pt 's HTN remains stable.  Denies CP, sob, DOE, pedal edema, headaches, dizziness or visual disturbances.  No complications from the medications currently being used.  Last BP : 111/61  PAST MEDICAL HISTORY : Reviewed.  No changes.  CURRENT MEDICATIONS: [facility]  REVIEW OF SYSTEMS: Unobtainable secondary to mental retardation  PHYSICAL EXAMINATION  GENERAL: no acute distress, moderately obese body habitus NECK: supple, trachea midline, no neck masses, no thyroid tenderness, no thyromegaly RESPIRATORY: breathing is even & unlabored, BS CTAB CARDIAC: RRR, no murmur,no extra heart sounds, no edema GI: abdomen soft, normal BS, no masses, no tenderness, no hepatomegaly, no splenomegaly EXTREMITIES: able to move all 4 extremities ; uses wheelchair PSYCHIATRIC: the patient is alert and is disoriented, affect & behavior appropriate  LABS/RADIOLOGY: 12/04/13 hgbA1c 5.8 09/23/13 sodium 131 potassium 4.1 glucose 84 BUN 8 creatinine 0.5 calcium 9.2 09/03/13 hemoglobin A1c 5.8 cholesterol 139 HDL 43 LDL 81 triglycerides 75 WBC 5.3 hemoglobin 13.8 hematocrit 42.9 sodium 128 potassium 4.1 glucose  163 BUN 9 creatinine 0.5 calcium 8.4 08/25/13 hemoglobin A1c 5.9 sodium 128 potassium 4.2 glucose 118 BUN 8 creatinine 0.5 calcium 9.2 cholesterol 143 HDL 50 LDL 84 triglycerides 44 9/14 WBC 6.3 hemoglobin 15 hematocrit 47.3  CT thoracic spine shows T12 compression fracture 05-31-13 LS-spine x-ray negative 6/14 BUN 36, creatinine 1.6 otherwise CMP normal 5/14 albumin 3.4 otherwise liver profile normal, fasting lipid panel normal, Tegretol level 9.4, phenobarbital level 7.1 3/14 sodium 129, glucose 145 otherwise BMP normal, platelets 149 otherwise CBC normal,  Tegretol 11.1 11/13 CMP normal, fasting lipid panel normal  ASSESSMENT/PLAN:  Hypertension-  Well-controlled; continue diltiazem  . diabetes mellitus- well controlled. Continue Lantus, NovoLog sliding scale and Actos  atrial fibrillation-rate controlled; continue Lopressor  CHF-well compensated.  seizure disorder-well-controlled; continue Tegretol  anxiety-stable; recently discontinued BuSpar  Intellect Disability - continue supportive care  Constipation - no complaints; continue senna S.  GERD - continue Zantac   CPT CODE: [id number]   Ella BodoMonina Vargas - NP [facility] [phone]

## 2014-01-28 NOTE — Progress Notes (Signed)
Patient ID: Tony CoriaMark Terry, male   DOB: [birth year], 58 y.o.   MRN: [id number]                 PROGRESS NOTE  DATE: 12/10/13  [facility]  LEVEL OF CARE: SNF (31)   Routine Visit  CHIEF COMPLAINT:  Manage, HTN, CHF,diabetes mellitus and atrial fibrillation  HISTORY OF PRESENT ILLNESS:  REASSESSMENT OF ONGOING PROBLEM(S):  SEIZURE DISORDER: The patient's seizure disorder remains stable. No complications reported from the medications presently being used. Staff do not report any recent seizure activity.  HTN: Pt 's HTN remains stable.  Denies CP, sob, DOE, pedal edema, headaches, dizziness or visual disturbances.  No complications from the medications currently being used.  Last BP : 126/67  GERD: pt's GERD is stable.  Denies ongoing heartburn, abd. Pain, nausea or vomiting.  Currently on a PPI & tolerates it without any adverse reactions.  PAST MEDICAL HISTORY : Reviewed.  No changes.  CURRENT MEDICATIONS: [facility]  REVIEW OF SYSTEMS: Unobtainable secondary to mental retardation  PHYSICAL EXAMINATION  GENERAL: no acute distress, moderately obese body habitus NECK: supple, trachea midline, no neck masses, no thyroid tenderness, no thyromegaly RESPIRATORY: breathing is even & unlabored, BS CTAB CARDIAC: RRR, no murmur,no extra heart sounds, no edema GI: abdomen soft, normal BS, no masses, no tenderness, no hepatomegaly, no splenomegaly PSYCHIATRIC: the patient is alert and is disoriented, affect & behavior appropriate  LABS/RADIOLOGY: 12/04/13 hgbA1c 5.8 09/23/13 sodium 131 potassium 4.1 glucose 84 BUN 8 creatinine 0.5 calcium 9.2 09/03/13 hemoglobin A1c 5.8 cholesterol 139 HDL 43 LDL 81 triglycerides 75 WBC 5.3 hemoglobin 13.8 hematocrit 42.9 sodium 128 potassium 4.1 glucose 163 BUN 9 creatinine 0.5 calcium 8.4 08/25/13 hemoglobin A1c 5.9 sodium 128 potassium 4.2 glucose 118 BUN 8 creatinine 0.5 calcium 9.2 cholesterol 143 HDL 50 LDL 84  triglycerides 44 9/14 WBC 6.3 hemoglobin 15 hematocrit 47.3  CT thoracic spine shows T12 compression fracture 05-31-13 LS-spine x-ray negative 6/14 BUN 36, creatinine 1.6 otherwise CMP normal 5/14 albumin 3.4 otherwise liver profile normal, fasting lipid panel normal, Tegretol level 9.4, phenobarbital level 7.1 3/14 sodium 129, glucose 145 otherwise BMP normal, platelets 149 otherwise CBC normal,  Tegretol 11.1 11/13 CMP normal, fasting lipid panel normal  ASSESSMENT/PLAN:  Hypertension-  Well-controlled; continue diltiazem  . diabetes mellitus- well controlled. Continue Lantus, NovoLog sliding scale and Actos  atrial fibrillation-rate controlled; continue Lopressor  CHF-well compensated.  seizure disorder-well-controlled; continue Tegretol  anxiety-stable; continue BuSpar  Intellect Disability - continue supportive care  Constipation - no complaints; continue senna S.  GERD - continue Zantac   CPT CODE: [id number]   Ella BodoMonina Vargas - NP [facility] [phone]

## 2014-03-03 LAB — LIPID PANEL
CHOLESTEROL: 133 mg/dL (ref 0–200)
HDL: 41 mg/dL (ref 35–70)
LDL CALC: 83 mg/dL
Triglycerides: 45 mg/dL (ref 40–160)

## 2014-03-03 LAB — CBC AND DIFFERENTIAL
HCT: 46 % (ref 41–53)
HEMOGLOBIN: 15 g/dL (ref 13.5–17.5)
Platelets: 133 10*3/uL — AB (ref 150–399)
WBC: 5.2 10*3/mL

## 2014-03-03 LAB — BASIC METABOLIC PANEL
BUN: 9 mg/dL (ref 4–21)
GLUCOSE: 76 mg/dL
POTASSIUM: 4.5 mmol/L (ref 3.4–5.3)
SODIUM: 130 mmol/L — AB (ref 137–147)
Sodium: 13 mmol/L — AB (ref 137–147)

## 2014-03-03 LAB — HEMOGLOBIN A1C: Hgb A1c MFr Bld: 5.9 % (ref 4.0–6.0)

## 2014-03-09 ENCOUNTER — Encounter: Payer: Self-pay | Admitting: *Deleted

## 2014-03-26 ENCOUNTER — Non-Acute Institutional Stay (SKILLED_NURSING_FACILITY): Payer: Medicare Other | Admitting: Adult Health

## 2014-03-26 ENCOUNTER — Encounter: Payer: Self-pay | Admitting: Adult Health

## 2014-03-26 DIAGNOSIS — E119 Type 2 diabetes mellitus without complications: Secondary | ICD-10-CM

## 2014-03-26 DIAGNOSIS — R569 Unspecified convulsions: Secondary | ICD-10-CM

## 2014-03-26 DIAGNOSIS — I4891 Unspecified atrial fibrillation: Secondary | ICD-10-CM

## 2014-03-26 DIAGNOSIS — K219 Gastro-esophageal reflux disease without esophagitis: Secondary | ICD-10-CM

## 2014-03-26 DIAGNOSIS — I509 Heart failure, unspecified: Secondary | ICD-10-CM

## 2014-03-26 DIAGNOSIS — I482 Chronic atrial fibrillation, unspecified: Secondary | ICD-10-CM

## 2014-03-26 DIAGNOSIS — K5900 Constipation, unspecified: Secondary | ICD-10-CM

## 2014-03-26 DIAGNOSIS — F79 Unspecified intellectual disabilities: Secondary | ICD-10-CM

## 2014-03-26 DIAGNOSIS — I10 Essential (primary) hypertension: Secondary | ICD-10-CM

## 2014-03-26 NOTE — Progress Notes (Signed)
Patient ID: Carlene CoriaMark Brauer, male   DOB: 02-18-1956, 58 y.o.   MRN: [id number]         PROGRESS NOTE  DATE: 03/26/14  [facility]  LEVEL OF CARE: SNF (31)   Routine Visit  CHIEF COMPLAINT:  Manage, HTN, CHF,diabetes mellitus and atrial fibrillation  HISTORY OF PRESENT ILLNESS:  REASSESSMENT OF ONGOING PROBLEM(S):  HTN: Pt 's HTN remains stable.  Denies CP, sob, DOE, pedal edema, headaches, dizziness or visual disturbances.  No complications from the medications currently being used.  Last BP : 113/73  DM:pt's DM remains stable.  Pt denies polyuria, polydipsia, polyphagia, changes in vision or hypoglycemic episodes.  No complications noted from the medication presently being used.  12/14 hemoglobin A1c is: 5.9  GERD: pt's GERD is stable.  Denies ongoing heartburn, abd. Pain, nausea or vomiting.  Currently on a PPI & tolerates it without any adverse reactions.  PAST MEDICAL HISTORY : Reviewed.  No changes.  CURRENT MEDICATIONS: [facility]  REVIEW OF SYSTEMS: Unobtainable secondary to intellectual disability  PHYSICAL EXAMINATION  GENERAL: no acute distress, moderately obese body habitus EYES: Lids open and close normally. No blepharitis, entropion or ectropion. PERRL NECK: supple, trachea midline, no neck masses, no thyroid tenderness, no thyromegaly RESPIRATORY: breathing is even & unlabored, BS CTAB CARDIAC: RRR, no murmur,no extra heart sounds, no edema GI: abdomen soft, normal BS, no masses, no tenderness, no hepatomegaly, no splenomegaly EXTREMITIES: able to move all 4 extremities ; uses wheelchair PSYCHIATRIC: the patient is alert and is disoriented, affect & behavior appropriate   LABS/RADIOLOGY:  5/15  Primidone, serum 7.2  phenobarbital 7.7 cholesterol 133 HDL 41 LDL 83 triglycerides 45 Tegretol 2.4 sodium 130 potassium 4.5 glucose 76 BUN 9 creatinine 0.6 calcium 8.5 WBC 5.2 hemoglobin 15 hematocrit 45.7 hemoglobin A1c 5.9 12/04/13  hgbA1c 5.8 09/23/13 sodium 131 potassium 4.1 glucose 84 BUN 8 creatinine 0.5 calcium 9.2 09/03/13 hemoglobin A1c 5.8 cholesterol 139 HDL 43 LDL 81 triglycerides 75 WBC 5.3 hemoglobin 13.8 hematocrit 42.9 sodium 128 potassium 4.1 glucose 163 BUN 9 creatinine 0.5 calcium 8.4 08/25/13 hemoglobin A1c 5.9 sodium 128 potassium 4.2 glucose 118 BUN 8 creatinine 0.5 calcium 9.2 cholesterol 143 HDL 50 LDL 84 triglycerides 44 9/14 WBC 6.3 hemoglobin 15 hematocrit 47.3  CT thoracic spine shows T12 compression fracture 05-31-13 LS-spine x-ray negative 6/14 BUN 36, creatinine 1.6 otherwise CMP normal 5/14 albumin 3.4 otherwise liver profile normal, fasting lipid panel normal, Tegretol level 9.4, phenobarbital level 7.1 3/14 sodium 129, glucose 145 otherwise BMP normal, platelets 149 otherwise CBC normal,  Tegretol 11.1 11/13 CMP normal, fasting lipid panel normal  ASSESSMENT/PLAN:  Hypertension -  Well-controlled; continue diltiazem  . diabetes mellitus - well controlled. Continue Lantus, NovoLog sliding scale and Actos  atrial fibrillation - rate controlled; continue Lopressor  CHF-well compensated.  seizure disorder-well-controlled; continue Tegretol  Intellectual Disability - continue supportive care  Constipation - no complaints; continue senna S.  GERD - continue Zantac   CPT CODE: [id number]   Ella BodoMonina Vargas - NP [facility] [phone]

## 2014-04-15 ENCOUNTER — Non-Acute Institutional Stay (SKILLED_NURSING_FACILITY): Payer: Medicare Other | Admitting: Internal Medicine

## 2014-04-15 DIAGNOSIS — I482 Chronic atrial fibrillation, unspecified: Secondary | ICD-10-CM

## 2014-04-15 DIAGNOSIS — M545 Low back pain, unspecified: Secondary | ICD-10-CM

## 2014-04-15 DIAGNOSIS — I10 Essential (primary) hypertension: Secondary | ICD-10-CM

## 2014-04-15 DIAGNOSIS — I4891 Unspecified atrial fibrillation: Secondary | ICD-10-CM

## 2014-04-15 DIAGNOSIS — E119 Type 2 diabetes mellitus without complications: Secondary | ICD-10-CM

## 2014-04-16 DIAGNOSIS — I10 Essential (primary) hypertension: Secondary | ICD-10-CM | POA: Insufficient documentation

## 2014-04-16 LAB — HEPATIC FUNCTION PANEL
ALT: 18 U/L (ref 10–40)
AST: 24 U/L (ref 14–40)
Bilirubin, Total: 0.5 mg/dL

## 2014-04-16 NOTE — Progress Notes (Signed)
        PROGRESS NOTE  DATE: 04-15-14  FACILITY: [facility]  LEVEL OF CARE: SNF  Routine Visit  CHIEF COMPLAINT:  Manage, HTN, diabetes mellitus and atrial fibrillation  HISTORY OF PRESENT ILLNESS:  REASSESSMENT OF ONGOING PROBLEM(S):  DM:pt's DM remains stable.  staff deny polyuria, polydipsia, polyphagia, changes in vision or hypoglycemic episodes.  No complications noted from the medication presently being used.  Last hemoglobin A1c is: hemoglobin A1c 6.2 in 11/13, in  8-14 hemoglobin A1c 6.2, in 5-15 HbA1c 5.9  ATRIAL FIBRILLATION: the patients a-fib remains stable.  The staff deny DOE, tachycardia, orthopnea, transient neurological sx, pedal edema, palpitations, & PNDs.  No complications noted from the medications currently being used.  HTN: Pt 's HTN remains stable.  Denies CP, sob, DOE, pedal edema, headaches, dizziness or visual disturbances.  No complications from the medications currently being used.  Last BP : 144/80, 164/85, 147/82,108/54  PAST MEDICAL HISTORY : Reviewed.  No changes.  CURRENT MEDICATIONS: [facility]  REVIEW OF SYSTEMS: Unobtainable secondary to mental retardation  PHYSICAL EXAMINATION  GENERAL: no acute distress, moderately obese body habitus EYES: Normal sclerae, normal conjunctivae, no discharge NECK: supple, trachea midline, no neck masses, no thyroid tenderness, no thyromegaly LYMPHATICS: No cervical lymphadenopathy, no supraclavicular lymphadenopathy RESPIRATORY: breathing is even & unlabored, BS CTAB CARDIAC: RRR, no murmur,no extra heart sounds, no edema GI: abdomen soft, normal BS, no masses, no tenderness, no hepatomegaly, no splenomegaly PSYCHIATRIC: the patient is alert and is disoriented, affect & behavior appropriate  LABS/RADIOLOGY: 5-15 primidone 7.7, phenobarbital level 7.7, fasting lipid panel normal, tegretol level 2.4, chloride 94 otherwise BMP, platelets 133 otherwise CBC normal  05-31-13 LS-spine x-ray negative  6/14  BUN 36, creatinine 1.6 otherwise CMP normal 5/14 albumin 3.4 otherwise liver profile normal, fasting lipid panel normal, Tegretol level 9.4, phenobarbital level 7.1 3/14 sodium 129, glucose 145 otherwise BMP normal, platelets 149 otherwise CBC normal, Tegretol 11.1 11/13 CMP normal, fasting lipid panel normal  ASSESSMENT/PLAN:  Hypertension-well controlled. diabetes mellitus-well controlled.  atrial fibrillation-rate controlled. Low back pain-continue pain medications CHF-well compensated. seizure disorder-well-controlled. Constipation-continue laxatives GERD-continue PPI anxiety-stable. Check liver profile.  CPT CODE: [id number]  Newton PiggGayani Y. Kerry Doryasanayaka, MD [facility] [phone]

## 2014-04-22 ENCOUNTER — Emergency Department (HOSPITAL_COMMUNITY): Payer: Medicare Other

## 2014-04-22 ENCOUNTER — Other Ambulatory Visit: Payer: Self-pay | Admitting: *Deleted

## 2014-04-22 ENCOUNTER — Encounter (HOSPITAL_COMMUNITY): Payer: Self-pay | Admitting: Emergency Medicine

## 2014-04-22 ENCOUNTER — Emergency Department (HOSPITAL_COMMUNITY)
Admission: EM | Admit: 2014-04-22 | Discharge: 2014-04-22 | Disposition: A | Discharge status: Home / Self Care | Payer: Medicare Other | Attending: Emergency Medicine | Admitting: Emergency Medicine

## 2014-04-22 DIAGNOSIS — Z79899 Other long term (current) drug therapy: Secondary | ICD-10-CM | POA: Diagnosis not present

## 2014-04-22 DIAGNOSIS — I509 Heart failure, unspecified: Secondary | ICD-10-CM | POA: Insufficient documentation

## 2014-04-22 DIAGNOSIS — S4980XA Other specified injuries of shoulder and upper arm, unspecified arm, initial encounter: Secondary | ICD-10-CM | POA: Diagnosis present

## 2014-04-22 DIAGNOSIS — Z8781 Personal history of (healed) traumatic fracture: Secondary | ICD-10-CM | POA: Diagnosis not present

## 2014-04-22 DIAGNOSIS — W1809XA Striking against other object with subsequent fall, initial encounter: Secondary | ICD-10-CM | POA: Diagnosis not present

## 2014-04-22 DIAGNOSIS — Y9210 Unspecified residential institution as the place of occurrence of the external cause: Secondary | ICD-10-CM | POA: Diagnosis not present

## 2014-04-22 DIAGNOSIS — I4891 Unspecified atrial fibrillation: Secondary | ICD-10-CM | POA: Insufficient documentation

## 2014-04-22 DIAGNOSIS — Z794 Long term (current) use of insulin: Secondary | ICD-10-CM | POA: Diagnosis not present

## 2014-04-22 DIAGNOSIS — S46909A Unspecified injury of unspecified muscle, fascia and tendon at shoulder and upper arm level, unspecified arm, initial encounter: Secondary | ICD-10-CM | POA: Diagnosis not present

## 2014-04-22 DIAGNOSIS — Z8709 Personal history of other diseases of the respiratory system: Secondary | ICD-10-CM | POA: Diagnosis not present

## 2014-04-22 DIAGNOSIS — M25511 Pain in right shoulder: Secondary | ICD-10-CM

## 2014-04-22 DIAGNOSIS — G40909 Epilepsy, unspecified, not intractable, without status epilepticus: Secondary | ICD-10-CM | POA: Insufficient documentation

## 2014-04-22 DIAGNOSIS — F79 Unspecified intellectual disabilities: Secondary | ICD-10-CM | POA: Diagnosis not present

## 2014-04-22 DIAGNOSIS — Y9389 Activity, other specified: Secondary | ICD-10-CM | POA: Diagnosis not present

## 2014-04-22 DIAGNOSIS — K5900 Constipation, unspecified: Secondary | ICD-10-CM | POA: Insufficient documentation

## 2014-04-22 DIAGNOSIS — W19XXXA Unspecified fall, initial encounter: Secondary | ICD-10-CM

## 2014-04-22 DIAGNOSIS — K219 Gastro-esophageal reflux disease without esophagitis: Secondary | ICD-10-CM | POA: Diagnosis not present

## 2014-04-22 DIAGNOSIS — E119 Type 2 diabetes mellitus without complications: Secondary | ICD-10-CM | POA: Insufficient documentation

## 2014-04-22 DIAGNOSIS — Z7982 Long term (current) use of aspirin: Secondary | ICD-10-CM | POA: Diagnosis not present

## 2014-04-22 DIAGNOSIS — E669 Obesity, unspecified: Secondary | ICD-10-CM | POA: Diagnosis not present

## 2014-04-22 MED ORDER — TRAMADOL HCL 50 MG PO TABS: ORAL_TABLET | ORAL | Status: AC

## 2014-04-22 NOTE — ED Notes (Signed)
Per [facility] Was doing physical therapy in a wheelchair, wheelchair fell over to the right and he fell on his right shoulder onto carpet. No LOC, Denies hitting head. No obvious deformity to right shoulder. [facility] used mobile xray which showed possible shoulder fx. Pt denies neck or back pain. Pt is sitting up in bed and alert, guarding right shoulder. Pt has mental disabilities and [facility], pt is at his normal.

## 2014-04-22 NOTE — ED Provider Notes (Signed)
Pt seen and evaluated.  D/W Dr. Gordy LevanWalton. I agree with his assessment. Patient's tenderness is on anterior aspect of the shoulder. No pain at the distal humerus. He will have.the arm against resistance. He will Pulmicort him and pushed me away. Nontender over the clavicle. Nontender over the spine. Normal shoulder x-ray. Plan will be symptomatic treatment.  Tony PorterMark Zairah Arista, MD 04/22/14 Tony Bosworth1902

## 2014-04-22 NOTE — ED Provider Notes (Signed)
CSN: [id number]     Arrival date & time 04/22/14  1646 History   First MD Initiated Contact with Patient 04/22/14 1706     Chief Complaint  Patient presents with  . Fall  . Shoulder Injury     (Consider location/radiation/quality/duration/timing/severity/associated sxs/prior Treatment) HPI Comments: 58 year old male with a history of intellectual disabilities, CHF, atrial fibrillation who presents today after a fall during physical therapy in which he struck his right shoulder.  Patient is not doing significant amount of detail in his history. Brother the bedside and provides some history that he has not had a fall. Per the EMS report and per the report from nursing, he was sent over to rule out a right shoulder fracture.  Patient is a 58 y.o. male presenting with shoulder pain.  Shoulder Pain This is a new problem. The current episode started today. Pertinent negatives include no abdominal pain, vertigo or weakness.    Past Medical History  Diagnosis Date  . Unspecified intellectual disabilities   . Excitative type psychosis   . CHF (congestive heart failure)   . Chronic respiratory failure   . Atrial fibrillation   . Obstructive sleep apnea (adult) (pediatric)   . Diabetes mellitus without complication   . Edema   . Hyperlipidemia   . Hypertension   . Hypopotassemia   . Seizures   . GERD (gastroesophageal reflux disease)   . Unspecified constipation   . Obesity   . Femoral neck fracture    History reviewed. No pertinent past surgical history. Family History  Problem Relation Age of Onset  . Cancer Mother   . Cancer Father     throat cancer   History  Substance Use Topics  . Smoking status: Unknown If Ever Smoked  . Smokeless tobacco: Not on file  . Alcohol Use: No    Review of Systems  Unable to perform ROS: Other  Gastrointestinal: Negative for abdominal pain.  Neurological: Negative for vertigo and weakness.      Allergies  Review of patient's  allergies indicates no known allergies.  Home Medications   Prior to Admission medications   Medication Sig Start Date End Date Taking? Authorizing Provider  aspirin 81 MG chewable tablet Take 1 tablet by mouth daily for A-Fib  * use house stock*   Yes Historical Provider, MD  carbamazepine (TEGRETOL) 200 MG tablet Take 300 mg by mouth 3 (three) times daily.    Yes Historical Provider, MD  Cholecalciferol 5000 UNITS TABS Take 1 capsule by mouth every week on Monday   Yes Historical Provider, MD  diltiazem (CARDIZEM) 120 MG tablet Take 1 capsule by mouth daily for HTN   Yes Historical Provider, MD  insulin aspart (NOVOLOG) 100 UNIT/ML injection Inject into the skin. Check BS before meals if 100-150=5 units, if 151 and greater = 10 units  * No Bedtime Dose* for DM *Do Not mix with other insulins*  Expires 28 days after opening.  *check expiration date*   Yes Historical Provider, MD  insulin glargine (LANTUS) 100 UNIT/ML injection Inject 70 units subcutaneously every night at bedtime for DM  *Do Not Mix with other Insulins* expires 28 days after opening.  *check expiration date*   Yes Historical Provider, MD  metoprolol tartrate (LOPRESSOR) 25 MG tablet Take 75 mg by mouth 2 (two) times daily. **Hold for pulse </=50 and SBP</= 100**   Yes Historical Provider, MD  pioglitazone (ACTOS) 30 MG tablet Take 30 mg by mouth daily.    Yes Historical  Provider, MD  primidone (MYSOLINE) 50 MG tablet Take 200 mg by mouth 3 (three) times daily.    Yes Historical Provider, MD  ranitidine (ZANTAC) 75 MG tablet Take 75 mg by mouth at bedtime.    Yes Historical Provider, MD  sennosides-docusate sodium (SENOKOT-S) 8.6-50 MG tablet Take 1 tabley by mouth every night at bedtime for constipation  * Use House Stock*   Yes Historical Provider, MD  traMADol (ULTRAM) 50 MG tablet Take 1 tablet by mouth every 4 hours as needed for pain 04/22/14  Yes Kimber Relic, MD   BP 116/59  Pulse 65  Temp(Src) 98.2 F (36.8 C) (Oral)   Resp 18  SpO2 98% Physical Exam  Constitutional: He is oriented to person, place, and time. He appears well-developed and well-nourished. No distress.  HENT:  Head: Normocephalic and atraumatic.  Eyes: Pupils are equal, round, and reactive to light.  Neck: Normal range of motion.  Cardiovascular: Normal rate and regular rhythm.   Pulmonary/Chest: Effort normal and breath sounds normal.  Abdominal: Soft. He exhibits no distension. There is no tenderness.  Musculoskeletal: Normal range of motion.       Right shoulder: He exhibits tenderness. He exhibits normal range of motion, no bony tenderness, no swelling and no deformity.  Neurological: He is alert and oriented to person, place, and time.  Skin: Skin is warm. He is not diaphoretic.    ED Course  Procedures (including critical care time) Labs Review Labs Reviewed - No data to display  Imaging Review Dg Shoulder Right  04/22/2014   CLINICAL DATA:  Fall.  Right shoulder injury and pain.  EXAM: RIGHT SHOULDER - 2+ VIEW  COMPARISON:  None.  FINDINGS: There is no evidence of fracture or dislocation. There is no evidence of arthropathy or other focal bone abnormality. Soft tissues are unremarkable.  IMPRESSION: Negative.   Electronically Signed   By: Myles Rosenthal M.D.   On: 04/22/2014 18:19     EKG Interpretation None      MDM   Final diagnoses:  Right shoulder pain  Fall, initial encounter   58 year old male presents from his nursing home after a fall in his right shoulder. He was sent over by his nursing home for concern for a injury to his right shoulder.  Arrival here the patient appears in no acute distress. He is watching TV and appears comfortable. Patient without tenderness to palpation of the right shoulder. Patient mildly tender to passive movement of the right shoulder. Plan to obtain x-ray of the shoulder. Patient with no other complaints at this time.  Patient with no evidence of any head trauma. No report of any  head trauma per the nursing home documentation. X-ray demonstrates no acute fracture or dislocation. Will discharge to home. Recommend followup with primary care physician. Strict return precautions were given. Patient discharged home in stable condition. Patient seen and evaluated by myself and by the attending Dr. Fayrene Fearing.     Imagene Sheller, MD 04/22/14 [id number]

## 2014-04-22 NOTE — Telephone Encounter (Signed)
[facility] 

## 2014-04-22 NOTE — ED Notes (Signed)
[facility] arrived.  

## 2014-04-29 NOTE — ED Provider Notes (Signed)
I saw and evaluated the patient, reviewed the resident's note and I agree with the findings and plan.   EKG Interpretation None        Rolland PorterMark Katheryn Culliton, MD 04/29/14 1025

## 2014-05-11 ENCOUNTER — Encounter: Payer: Self-pay | Admitting: Adult Health

## 2014-05-11 ENCOUNTER — Non-Acute Institutional Stay (SKILLED_NURSING_FACILITY): Payer: Medicare Other | Admitting: Adult Health

## 2014-05-11 DIAGNOSIS — K219 Gastro-esophageal reflux disease without esophagitis: Secondary | ICD-10-CM

## 2014-05-11 DIAGNOSIS — I509 Heart failure, unspecified: Secondary | ICD-10-CM

## 2014-05-11 DIAGNOSIS — G4733 Obstructive sleep apnea (adult) (pediatric): Secondary | ICD-10-CM | POA: Insufficient documentation

## 2014-05-11 DIAGNOSIS — E119 Type 2 diabetes mellitus without complications: Secondary | ICD-10-CM

## 2014-05-11 DIAGNOSIS — I4891 Unspecified atrial fibrillation: Secondary | ICD-10-CM

## 2014-05-11 DIAGNOSIS — I482 Chronic atrial fibrillation, unspecified: Secondary | ICD-10-CM

## 2014-05-11 DIAGNOSIS — F79 Unspecified intellectual disabilities: Secondary | ICD-10-CM

## 2014-05-11 DIAGNOSIS — K5900 Constipation, unspecified: Secondary | ICD-10-CM

## 2014-05-11 DIAGNOSIS — R569 Unspecified convulsions: Secondary | ICD-10-CM

## 2014-05-11 DIAGNOSIS — I10 Essential (primary) hypertension: Secondary | ICD-10-CM

## 2014-05-11 NOTE — Progress Notes (Signed)
Patient ID: Tony CoriaMark Gipson, male   DOB: 11/30/1955, 58 y.o.   MRN: [id number]        PROGRESS NOTE  DATE:    05/11/14 FACILITY:  [facility]  LEVEL OF CARE: SNF  Routine Visit  CHIEF COMPLAINT:  Manage, HTN, diabetes mellitus and atrial fibrillation  HISTORY OF PRESENT ILLNESS:  REASSESSMENT OF ONGOING PROBLEM(S):  SEIZURE DISORDER: The patient's seizure disorder remains stable. No complications reported from the medications presently being used. Staff do not report any recent seizure activity.  CHF:The patient does not relate significant weight changes, denies sob, DOE, orthopnea, PNDs, pedal edema, palpitations or chest pain.  CHF remains stable.  No complications form the medications being used.  HTN: Pt 's HTN remains stable.  Denies CP, sob, DOE, pedal edema, headaches, dizziness or visual disturbances.  No complications from the medications currently being used.  Last BP : 134/72  PAST MEDICAL HISTORY : Reviewed.  No changes.  [facility]  REVIEW OF SYSTEMS: Unobtainable secondary to mental retardation  PHYSICAL EXAMINATION  GENERAL: no acute distress, moderately obese body habitus EYES: Normal sclerae, normal conjunctivae, no discharge NECK: supple, trachea midline, no neck masses, no thyroid tenderness, no thyromegaly RESPIRATORY: breathing is even & unlabored, BS CTAB CARDIAC: RRR, no murmur,no extra heart sounds, no edema GI: abdomen soft, normal BS, no masses, no tenderness, no hepatomegaly, no splenomegaly PSYCHIATRIC: the patient is alert and is disoriented, affect & behavior appropriate  LABS/RADIOLOGY: 04/16/14 LFT nl 5-15 primidone 7.7, phenobarbital level 7.7, fasting lipid panel normal, tegretol level 2.4, chloride 94 otherwise BMP, platelets 133 otherwise CBC normal 05-31-13 LS-spine x-ray negative 6/14 BUN 36, creatinine 1.6 otherwise CMP normal 5/14 albumin 3.4 otherwise liver profile normal, fasting lipid panel normal, Tegretol  level 9.4, phenobarbital level 7.1 3/14 sodium 129, glucose 145 otherwise BMP normal, platelets 149 otherwise CBC normal,  Tegretol 11.1 11/13 CMP normal, fasting lipid panel normal  ASSESSMENT/PLAN:  Hypertension - well controlled; continue diltiazem and Lopressor diabetes mellitus - well controlled; continue Novolog, Lantus and Actos atrial fibrillation - rate controlled; continue Lopressor and ASA CHF - well compensated. seizure disorder - well-controlled; continue Tegretol and Mysoline Constipation-continue laxatives GERD-continue Zantac Sleep apnea - has BIPAP machine @ [facility] Intellectual Disability - continue supportive care  CPT CODE: [id number]  Ella BodoMonina Vargas - NP [facility] [phone]

## 2014-08-10 ENCOUNTER — Non-Acute Institutional Stay (SKILLED_NURSING_FACILITY): Payer: Medicare Other | Admitting: Internal Medicine

## 2014-08-10 ENCOUNTER — Encounter: Payer: Self-pay | Admitting: Internal Medicine

## 2014-08-10 DIAGNOSIS — I10 Essential (primary) hypertension: Secondary | ICD-10-CM

## 2014-08-10 DIAGNOSIS — E871 Hypo-osmolality and hyponatremia: Secondary | ICD-10-CM

## 2014-08-10 DIAGNOSIS — I509 Heart failure, unspecified: Secondary | ICD-10-CM

## 2014-08-10 DIAGNOSIS — I482 Chronic atrial fibrillation, unspecified: Secondary | ICD-10-CM

## 2014-08-10 DIAGNOSIS — D696 Thrombocytopenia, unspecified: Secondary | ICD-10-CM

## 2014-08-10 DIAGNOSIS — E119 Type 2 diabetes mellitus without complications: Secondary | ICD-10-CM

## 2014-08-10 NOTE — Progress Notes (Signed)
Patient ID: Carlene CoriaMark Lampton, male   DOB: 1955/11/16, 58 y.o.   MRN: [id number]   [facility]   No Known Allergies  Code Status: Full Code  Goals of Care: Longevity/Long term care  Chief Complaint  Patient presents with  . Medical Management of Chronic Issues    HTN, DM2, afib, CHF    HPI 58 y.o. male with PMH of MR, chronic afib, HTN, CHF, DM2, GERD among many others is being seen for a routine visit. No falls or skin issues reported. Weight stable. No change in behaviors or functional status. No concerns from staff. cbg well controlled on review. BP on review ranges from SBP 90-150 with most of the readings in 110s. DBP stable  Review of Systems Unable to obtain  Past Medical History  Diagnosis Date  . Unspecified intellectual disabilities   . Excitative type psychosis   . CHF (congestive heart failure)   . Chronic respiratory failure   . Atrial fibrillation   . Obstructive sleep apnea (adult) (pediatric)   . Diabetes mellitus without complication   . Edema   . Hyperlipidemia   . Hypertension   . Hypopotassemia   . Seizures   . GERD (gastroesophageal reflux disease)   . Unspecified constipation   . Obesity   . Femoral neck fracture     No past surgical history on file.  History   Social History  . Marital Status: Single    Spouse Name: N/A    Number of Children: N/A  . Years of Education: N/A   Occupational History  . Not on file.   Social History Main Topics  . Smoking status: Unknown If Ever Smoked  . Smokeless tobacco: Not on file  . Alcohol Use: No  . Drug Use: Not on file  . Sexual Activity: Not on file   Other Topics Concern  . Not on file   Social History Narrative      Medication List       This list is accurate as of: 08/10/14  3:17 PM.  Always use your most recent med list.               aspirin 81 MG chewable tablet  Take 1 tablet by mouth daily for A-Fib  * use house stock*     carbamazepine 200 MG  tablet  Commonly known as:  TEGRETOL  Take 300 mg by mouth 3 (three) times daily. 1 and 1/2 tablets =300mg      Cholecalciferol 5000 UNITS Tabs  Take 1 capsule by mouth every week on Monday     diltiazem 120 MG tablet  Commonly known as:  CARDIZEM  Take 1 capsule by mouth daily for HTN     insulin aspart 100 UNIT/ML injection  Commonly known as:  novoLOG  Inject into the skin. Check BS before meals if 100-150=5 units, if 151 and greater = 10 units  * No Bedtime Dose* for DM *Do Not mix with other insulins*  Expires 28 days after opening.  *check expiration date*     insulin glargine 100 UNIT/ML injection  Commonly known as:  LANTUS  Inject 70 units subcutaneously every night at bedtime for DM  *Do Not Mix with other Insulins* expires 28 days after opening.  *check expiration date*     metoprolol tartrate 25 MG tablet  Commonly known as:  LOPRESSOR  Take 75 mg by mouth 2 (two) times daily. **Hold for pulse </=50 and SBP</= 100**  pioglitazone 30 MG tablet  Commonly known as:  ACTOS  Take 30 mg by mouth daily.     primidone 50 MG tablet  Commonly known as:  MYSOLINE  Take 200 mg by mouth 3 (three) times daily.     ranitidine 75 MG tablet  Commonly known as:  ZANTAC  Take 75 mg by mouth at bedtime. For GERD     sennosides-docusate sodium 8.6-50 MG tablet  Commonly known as:  SENOKOT-S  Take 1 tabley by mouth every night at bedtime for constipation  * Use House Stock*     traMADol 50 MG tablet  Commonly known as:  ULTRAM  Take 1 tablet by mouth every 4 hours as needed for pain        Physical Exam Filed Vitals:   08/10/14 1449  BP: 118/57  Pulse: 77  Temp: 98 F (36.7 C)  Resp: 18   Constitutional: WDWN adult male in no acute distress. Very pleasant.  HEENT: Normocephalic and atraumatic. PERRL. EOM intact. No icterus. Oral mucosa moist. Posterior pharynx clear of any exudate or lesions.  Neck: Supple and nontender. No lymphadenopathy, masses, or thyromegaly. No  JVD or carotid bruits. Cardiac: Normal S1, S2. RRR without appreciable murmurs, rubs, or gallops. Distal pulses intact. 1+ pitting leg edema bilaterally.  Lungs: No respiratory distress. Breath sounds clear bilaterally without rales, rhonchi, or wheezes. Abdomen: Audible bowel sounds in all quadrants. Soft, nontender, nondistended. No palpable mass.  Musculoskeletal: Able to move all extremities. No joint erythema or tenderness. Skin: Warm and dry. No rash noted. No erythema.  Neurological: Alert and oriented to person Psychiatric:  Appropriate mood and affect.   Labs Reviewed CBC Latest Ref Rng 03/03/2014  WBC - 5.2  Hemoglobin 13.5 - 17.5 g/dL 16.115.0  Hematocrit 41 - 53 % 46  Platelets 150 - 399 K/L 133(A)    CMP     Component Value Date/Time   NA 13* 03/03/2014   NA 130* 03/03/2014   K 4.5 03/03/2014   BUN 9 03/03/2014   AST 24 04/16/2014   ALT 18 04/16/2014    Lab Results  Component Value Date   HGBA1C 5.9 03/03/2014   Lipid Panel     Component Value Date/Time   CHOL 133 03/03/2014   TRIG 45 03/03/2014   HDL 41 03/03/2014   LDLCALC 83 03/03/2014   Assessment & Plan 1. Essential hypertension Has overall controlled bp readings with some low readings. Continue diltiazem 120mg  daily and will decrease metoprolol to 50 mg twice daily. Monitor bp q shift and if average bp reading > 140/90, consider ACEI. Check cmp  2. Chronic atrial fibrillation Stable. Rate controlled. Continue asa 81mg  daily and diltiazem 120 mg daily. Lopressor dosing reduced. monitor.   3. Type 2 diabetes mellitus without complication Stable. Last a1c 5.9 on 03/03/2014. CBGs range 80s to 120s. Decrease lantus to 60 u daily, check a1c. Change novolog to 5 u tid for cbg 150-250, and 10 u for cbg > 250 only. Continue actos. Continue mechanical soft, NAS, and CC diet. Currently not on statin, lipid is well controlled with diet. Last LDL 41 on 03/03/14. Currently not on ACEI or ARBs. Check urine  microalbumin  4. Congestive heart failure, unspecified congestive heart failure chronicity, unspecified congestive heart failure type Stable. No signs and symptoms of volume overload. Appears euvolemic on exam. Continue b blocker for now  5. Hyponatremia Recheck CMP next lab  6. Thrombocytopenia Ongoing over the past year. Could be carbamezapine or aspirin-induced.  Continue to monitor for now. Will recheck CBC to monitor trend. Continue bleeding precautions and monitor.   Labs Ordered: A1C, urine microalbumin/creatinine ratio, CBC, CMP  Family/Staff Communication Plan of care discuss with professional staff members. Professional staff members verbalize understanding and agree with plan of care. No additional questions or concerns reported.    Loura Back, MSN, AGNP-C [facility] [address] [place], [place] [address] [phone] [8am-5pm] After hours: [phone]   I have personally reviewed this note and agree with the care plan  [facility], MD  [facility] [phone] (Monday-Friday 8 am - 5 pm) [phone] (afterhours)

## 2014-09-11 ENCOUNTER — Emergency Department (HOSPITAL_COMMUNITY): Payer: Medicare Other

## 2014-09-11 ENCOUNTER — Encounter (HOSPITAL_COMMUNITY): Payer: Self-pay | Admitting: *Deleted

## 2014-09-11 ENCOUNTER — Inpatient Hospital Stay (HOSPITAL_COMMUNITY)
Admission: EM | Admit: 2014-09-11 | Discharge: 2014-09-18 | DRG: 193 | Disposition: A | Discharge status: Home / Self Care | Payer: Medicare Other | Attending: Internal Medicine | Admitting: Internal Medicine

## 2014-09-11 DIAGNOSIS — I429 Cardiomyopathy, unspecified: Secondary | ICD-10-CM | POA: Diagnosis present

## 2014-09-11 DIAGNOSIS — R52 Pain, unspecified: Secondary | ICD-10-CM

## 2014-09-11 DIAGNOSIS — J9622 Acute and chronic respiratory failure with hypercapnia: Secondary | ICD-10-CM | POA: Insufficient documentation

## 2014-09-11 DIAGNOSIS — D696 Thrombocytopenia, unspecified: Secondary | ICD-10-CM | POA: Diagnosis present

## 2014-09-11 DIAGNOSIS — G9340 Encephalopathy, unspecified: Secondary | ICD-10-CM | POA: Diagnosis present

## 2014-09-11 DIAGNOSIS — Z6841 Body Mass Index (BMI) 40.0 and over, adult: Secondary | ICD-10-CM

## 2014-09-11 DIAGNOSIS — Z9119 Patient's noncompliance with other medical treatment and regimen: Secondary | ICD-10-CM | POA: Diagnosis present

## 2014-09-11 DIAGNOSIS — R0602 Shortness of breath: Secondary | ICD-10-CM

## 2014-09-11 DIAGNOSIS — E785 Hyperlipidemia, unspecified: Secondary | ICD-10-CM | POA: Diagnosis present

## 2014-09-11 DIAGNOSIS — I482 Chronic atrial fibrillation, unspecified: Secondary | ICD-10-CM | POA: Insufficient documentation

## 2014-09-11 DIAGNOSIS — E669 Obesity, unspecified: Secondary | ICD-10-CM | POA: Diagnosis present

## 2014-09-11 DIAGNOSIS — Y95 Nosocomial condition: Secondary | ICD-10-CM | POA: Diagnosis present

## 2014-09-11 DIAGNOSIS — I4891 Unspecified atrial fibrillation: Secondary | ICD-10-CM | POA: Diagnosis present

## 2014-09-11 DIAGNOSIS — I509 Heart failure, unspecified: Secondary | ICD-10-CM | POA: Diagnosis present

## 2014-09-11 DIAGNOSIS — J189 Pneumonia, unspecified organism: Principal | ICD-10-CM | POA: Diagnosis present

## 2014-09-11 DIAGNOSIS — R7989 Other specified abnormal findings of blood chemistry: Secondary | ICD-10-CM | POA: Insufficient documentation

## 2014-09-11 DIAGNOSIS — F79 Unspecified intellectual disabilities: Secondary | ICD-10-CM | POA: Diagnosis present

## 2014-09-11 DIAGNOSIS — Z9989 Dependence on other enabling machines and devices: Secondary | ICD-10-CM

## 2014-09-11 DIAGNOSIS — E872 Acidosis: Secondary | ICD-10-CM | POA: Diagnosis present

## 2014-09-11 DIAGNOSIS — F419 Anxiety disorder, unspecified: Secondary | ICD-10-CM

## 2014-09-11 DIAGNOSIS — G4733 Obstructive sleep apnea (adult) (pediatric): Secondary | ICD-10-CM | POA: Diagnosis present

## 2014-09-11 DIAGNOSIS — J9610 Chronic respiratory failure, unspecified whether with hypoxia or hypercapnia: Secondary | ICD-10-CM | POA: Diagnosis present

## 2014-09-11 DIAGNOSIS — R569 Unspecified convulsions: Secondary | ICD-10-CM

## 2014-09-11 DIAGNOSIS — Z66 Do not resuscitate: Secondary | ICD-10-CM | POA: Diagnosis present

## 2014-09-11 DIAGNOSIS — E119 Type 2 diabetes mellitus without complications: Secondary | ICD-10-CM | POA: Insufficient documentation

## 2014-09-11 DIAGNOSIS — R7401 Elevation of levels of liver transaminase levels: Secondary | ICD-10-CM | POA: Insufficient documentation

## 2014-09-11 DIAGNOSIS — G40909 Epilepsy, unspecified, not intractable, without status epilepticus: Secondary | ICD-10-CM | POA: Diagnosis present

## 2014-09-11 DIAGNOSIS — R4182 Altered mental status, unspecified: Secondary | ICD-10-CM

## 2014-09-11 DIAGNOSIS — E876 Hypokalemia: Secondary | ICD-10-CM | POA: Diagnosis present

## 2014-09-11 DIAGNOSIS — K5900 Constipation, unspecified: Secondary | ICD-10-CM | POA: Diagnosis present

## 2014-09-11 DIAGNOSIS — I10 Essential (primary) hypertension: Secondary | ICD-10-CM

## 2014-09-11 DIAGNOSIS — R0902 Hypoxemia: Secondary | ICD-10-CM

## 2014-09-11 DIAGNOSIS — R740 Nonspecific elevation of levels of transaminase and lactic acid dehydrogenase [LDH]: Secondary | ICD-10-CM | POA: Diagnosis present

## 2014-09-11 DIAGNOSIS — J9602 Acute respiratory failure with hypercapnia: Secondary | ICD-10-CM | POA: Diagnosis present

## 2014-09-11 LAB — URINALYSIS, ROUTINE W REFLEX MICROSCOPIC
BILIRUBIN URINE: NEGATIVE
Glucose, UA: NEGATIVE mg/dL
HGB URINE DIPSTICK: NEGATIVE
Ketones, ur: NEGATIVE mg/dL
Leukocytes, UA: NEGATIVE
NITRITE: NEGATIVE
PROTEIN: NEGATIVE mg/dL
Specific Gravity, Urine: 1.014 (ref 1.005–1.030)
UROBILINOGEN UA: 1 mg/dL (ref 0.0–1.0)
pH: 7.5 (ref 5.0–8.0)

## 2014-09-11 LAB — POCT I-STAT 3, ART BLOOD GAS (G3+)
ACID-BASE EXCESS: 18 mmol/L — AB (ref 0.0–2.0)
Bicarbonate: 48.7 mEq/L — ABNORMAL HIGH (ref 20.0–24.0)
O2 SAT: 93 %
PCO2 ART: 83.1 mmHg — AB (ref 35.0–45.0)
PO2 ART: 76 mmHg — AB (ref 80.0–100.0)
Patient temperature: 99.1
pH, Arterial: 7.377 (ref 7.350–7.450)

## 2014-09-11 LAB — CBC
HCT: 48.8 % (ref 39.0–52.0)
HEMOGLOBIN: 15.6 g/dL (ref 13.0–17.0)
MCH: 32.5 pg (ref 26.0–34.0)
MCHC: 32 g/dL (ref 30.0–36.0)
MCV: 101.7 fL — ABNORMAL HIGH (ref 78.0–100.0)
Platelets: 120 10*3/uL — ABNORMAL LOW (ref 150–400)
RBC: 4.8 MIL/uL (ref 4.22–5.81)
RDW: 13.8 % (ref 11.5–15.5)
WBC: 8.6 10*3/uL (ref 4.0–10.5)

## 2014-09-11 LAB — COMPREHENSIVE METABOLIC PANEL
ALT: 19 U/L (ref 0–53)
ANION GAP: 6 (ref 5–15)
AST: 26 U/L (ref 0–37)
Albumin: 3.5 g/dL (ref 3.5–5.2)
Alkaline Phosphatase: 80 U/L (ref 39–117)
BUN: 12 mg/dL (ref 6–23)
CO2: 43 mEq/L (ref 19–32)
CREATININE: 0.5 mg/dL (ref 0.50–1.35)
Calcium: 9.2 mg/dL (ref 8.4–10.5)
Chloride: 91 mEq/L — ABNORMAL LOW (ref 96–112)
GFR calc Af Amer: >90 mL/min (ref >90)
Glucose, Bld: 117 mg/dL — ABNORMAL HIGH (ref 70–99)
Potassium: 4.4 mEq/L (ref 3.7–5.3)
Sodium: 140 mEq/L (ref 137–147)
Total Bilirubin: 0.4 mg/dL (ref 0.3–1.2)
Total Protein: 8.7 g/dL — ABNORMAL HIGH (ref 6.0–8.3)

## 2014-09-11 LAB — PRO B NATRIURETIC PEPTIDE: Pro B Natriuretic peptide (BNP): 64.9 pg/mL (ref 0–125)

## 2014-09-11 LAB — GLUCOSE, CAPILLARY: Glucose-Capillary: 100 mg/dL — ABNORMAL HIGH (ref 70–99)

## 2014-09-11 LAB — MRSA PCR SCREENING: MRSA by PCR: NEGATIVE

## 2014-09-11 LAB — PHENOBARBITAL LEVEL: PHENOBARBITAL: 10.8 ug/mL — AB (ref 15.0–40.0)

## 2014-09-11 LAB — TSH: TSH: 2.45 u[IU]/mL (ref 0.350–4.500)

## 2014-09-11 LAB — CARBAMAZEPINE LEVEL, TOTAL: CARBAMAZEPINE LVL: 7.6 ug/mL (ref 4.0–12.0)

## 2014-09-11 MED ORDER — ACETAMINOPHEN 325 MG PO TABS
650.0000 mg | ORAL_TABLET | Freq: Four times a day (QID) | ORAL | Status: DC | PRN
  Administered 2014-09-12 – 2014-09-13 (×2): 650 mg via ORAL
  Filled 2014-09-11 (×2): qty 2

## 2014-09-11 MED ORDER — ASPIRIN 81 MG PO CHEW
81.0000 mg | CHEWABLE_TABLET | Freq: Every day | ORAL | Status: DC
  Administered 2014-09-12 – 2014-09-18 (×7): 81 mg via ORAL
  Filled 2014-09-11 (×7): qty 1

## 2014-09-11 MED ORDER — ACETAMINOPHEN 650 MG RE SUPP: 650.0000 mg | Freq: Four times a day (QID) | RECTAL | Status: DC | PRN

## 2014-09-11 MED ORDER — INSULIN GLARGINE 100 UNIT/ML ~~LOC~~ SOLN
35.0000 [IU] | Freq: Every day | SUBCUTANEOUS | Status: DC
  Administered 2014-09-11 – 2014-09-12 (×2): 35 [IU] via SUBCUTANEOUS
  Filled 2014-09-11 (×4): qty 0.35

## 2014-09-11 MED ORDER — PRIMIDONE 50 MG PO TABS
200.0000 mg | ORAL_TABLET | Freq: Three times a day (TID) | ORAL | Status: DC
  Administered 2014-09-11 – 2014-09-18 (×19): 200 mg via ORAL
  Filled 2014-09-11 (×22): qty 4

## 2014-09-11 MED ORDER — CARBAMAZEPINE 200 MG PO TABS
300.0000 mg | ORAL_TABLET | Freq: Three times a day (TID) | ORAL | Status: DC
  Administered 2014-09-11 – 2014-09-18 (×19): 300 mg via ORAL
  Filled 2014-09-11 (×24): qty 1.5

## 2014-09-11 MED ORDER — DEXTROSE 5 % IV SOLN
2.0000 g | Freq: Two times a day (BID) | INTRAVENOUS | Status: DC
  Administered 2014-09-12 – 2014-09-14 (×5): 2 g via INTRAVENOUS
  Filled 2014-09-11 (×7): qty 2

## 2014-09-11 MED ORDER — VANCOMYCIN HCL 10 G IV SOLR
1500.0000 mg | Freq: Two times a day (BID) | INTRAVENOUS | Status: DC
  Administered 2014-09-12 – 2014-09-14 (×5): 1500 mg via INTRAVENOUS
  Filled 2014-09-11 (×7): qty 1500

## 2014-09-11 MED ORDER — SODIUM CHLORIDE 0.9 % IJ SOLN
3.0000 mL | Freq: Two times a day (BID) | INTRAMUSCULAR | Status: DC
  Administered 2014-09-11 – 2014-09-17 (×11): 3 mL via INTRAVENOUS

## 2014-09-11 MED ORDER — SODIUM CHLORIDE 0.9 % IV SOLN
INTRAVENOUS | Status: DC
  Administered 2014-09-11: 10 mL/h via INTRAVENOUS

## 2014-09-11 MED ORDER — METOPROLOL TARTRATE 25 MG PO TABS
75.0000 mg | ORAL_TABLET | Freq: Two times a day (BID) | ORAL | Status: DC
  Administered 2014-09-11 – 2014-09-18 (×14): 75 mg via ORAL
  Filled 2014-09-11 (×15): qty 1

## 2014-09-11 MED ORDER — SODIUM CHLORIDE 0.9 % IV BOLUS (SEPSIS): 1000.0000 mL | Freq: Once | INTRAVENOUS | Status: DC

## 2014-09-11 MED ORDER — LEVALBUTEROL HCL 0.63 MG/3ML IN NEBU: 0.6300 mg | INHALATION_SOLUTION | Freq: Four times a day (QID) | RESPIRATORY_TRACT | Status: DC | PRN

## 2014-09-11 MED ORDER — VANCOMYCIN HCL IN DEXTROSE 1-5 GM/200ML-% IV SOLN
1000.0000 mg | Freq: Once | INTRAVENOUS | Status: AC
  Administered 2014-09-11: 1000 mg via INTRAVENOUS

## 2014-09-11 MED ORDER — DILTIAZEM HCL ER COATED BEADS 120 MG PO CP24
120.0000 mg | ORAL_CAPSULE | Freq: Every day | ORAL | Status: DC
  Administered 2014-09-12 – 2014-09-18 (×7): 120 mg via ORAL
  Filled 2014-09-11 (×7): qty 1

## 2014-09-11 MED ORDER — SODIUM CHLORIDE 0.9 % IV BOLUS (SEPSIS)
1000.0000 mL | INTRAVENOUS | Status: DC
  Administered 2014-09-11: 1000 mL via INTRAVENOUS

## 2014-09-11 MED ORDER — HEPARIN SODIUM (PORCINE) 5000 UNIT/ML IJ SOLN
5000.0000 [IU] | Freq: Three times a day (TID) | INTRAMUSCULAR | Status: DC
  Administered 2014-09-11 – 2014-09-14 (×8): 5000 [IU] via SUBCUTANEOUS
  Filled 2014-09-11 (×11): qty 1

## 2014-09-11 MED ORDER — ONDANSETRON HCL 4 MG PO TABS: 4.0000 mg | ORAL_TABLET | Freq: Four times a day (QID) | ORAL | Status: DC | PRN

## 2014-09-11 MED ORDER — DEXTROSE 5 % IV SOLN
2.0000 g | Freq: Once | INTRAVENOUS | Status: AC
  Administered 2014-09-11: 2 g via INTRAVENOUS

## 2014-09-11 MED ORDER — INSULIN ASPART 100 UNIT/ML ~~LOC~~ SOLN
0.0000 [IU] | Freq: Three times a day (TID) | SUBCUTANEOUS | Status: DC
  Administered 2014-09-12: 3 [IU] via SUBCUTANEOUS

## 2014-09-11 MED ORDER — ONDANSETRON HCL 4 MG/2ML IJ SOLN: 4.0000 mg | Freq: Four times a day (QID) | INTRAMUSCULAR | Status: DC | PRN

## 2014-09-11 MED ORDER — INSULIN ASPART 100 UNIT/ML ~~LOC~~ SOLN: 0.0000 [IU] | Freq: Every day | SUBCUTANEOUS | Status: DC

## 2014-09-11 NOTE — ED Notes (Signed)
Blood work to be added on per lab.

## 2014-09-11 NOTE — H&P (Signed)
[facility] History and Physical  Tony Terry HYQ:[id number] DOB: June 04, 1956 DOA: 09/11/2014   PCP: Merlene Laughter, MD  Specialists: None  Chief Complaint: Decreased level of consciousness and shortness of breath  HPI: Tony Terry is a 58 y.o. male with a past medical history of for mental retardation, congestive heart failure of unknown type, chronic respiratory failure, atrial fibrillation, obstructive sleep apnea, diabetes mellitus type 2, seizure disorder who lives in a skilled nursing facility and was brought in today due to decreased level of consciousness and worsening shortness of breath. Apparently, his carbon dioxide level was high according to EMS reports. Patient is accompanied by his guardian Ms Alcario Drought who tells me that he is usually not very compliant with his CPAP. Patient was also diagnosed with pneumonia on December 1 and was started on Avelox. At that time his symptoms were primarily minor cough with decrease in oxygen levels. At that time he was not short of breath. Due to his mental disability patient is unable to provide any history. He does awaken with oral command and is able to converse but appears to be confused. He definitely is altered according to his guardian. No history of falls or injuries.  Home Medications: Prior to Admission medications   Medication Sig Start Date End Date Taking? Authorizing Provider  aspirin 81 MG chewable tablet Take 1 tablet by mouth daily for A-Fib  * use house stock*   Yes Historical Provider, MD  carbamazepine (TEGRETOL) 200 MG tablet Take 300 mg by mouth 3 (three) times daily. 1 and 1/2 tablets =359m   Yes Historical Provider, MD  diltiazem (CARDIZEM) 120 MG tablet Take 1 capsule by mouth daily for HTN   Yes Historical Provider, MD  insulin aspart (NOVOLOG) 100 UNIT/ML injection Inject into the skin. Check BS before meals if 100-150=5 units, if 151 and greater = 10 units  * No Bedtime Dose* for DM *Do Not mix with other  insulins*  Expires 28 days after opening.  *check expiration date*   Yes Historical Provider, MD  insulin glargine (LANTUS) 100 UNIT/ML injection Inject 70 units subcutaneously every night at bedtime for DM  *Do Not Mix with other Insulins* expires 28 days after opening.  *check expiration date*   Yes Historical Provider, MD  metoprolol tartrate (LOPRESSOR) 25 MG tablet Take 75 mg by mouth 2 (two) times daily. **Hold for pulse </=50 and SBP</= 100**   Yes Historical Provider, MD  pioglitazone (ACTOS) 30 MG tablet Take 30 mg by mouth daily.    Yes Historical Provider, MD  primidone (MYSOLINE) 50 MG tablet Take 200 mg by mouth 3 (three) times daily.    Yes Historical Provider, MD  ranitidine (ZANTAC) 75 MG tablet Take 75 mg by mouth at bedtime. For GERD   Yes Historical Provider, MD  sennosides-docusate sodium (SENOKOT-S) 8.6-50 MG tablet Take 1 tabley by mouth every night at bedtime for constipation  * Use House Stock*   Yes Historical Provider, MD  traMADol (ULTRAM) 50 MG tablet Take 1 tablet by mouth every 4 hours as needed for pain 04/22/14  Yes AEstill Dooms MD  Vitamin D, Ergocalciferol, (DRISDOL) 50000 UNITS CAPS capsule Take 50,000 Units by mouth every 7 (seven) days. MONDAY   Yes Historical Provider, MD    Allergies: No Known Allergies  Past Medical History: Past Medical History  Diagnosis Date  . Unspecified intellectual disabilities   . Excitative type psychosis   . CHF (congestive heart failure)   . Chronic respiratory failure   .  Atrial fibrillation   . Obstructive sleep apnea (adult) (pediatric)   . Diabetes mellitus without complication   . Edema   . Hyperlipidemia   . Hypertension   . Hypopotassemia   . Seizures   . GERD (gastroesophageal reflux disease)   . Unspecified constipation   . Obesity   . Femoral neck fracture     History reviewed. No pertinent past surgical history.  Social History: Patient lives in a skilled nursing facility. No history of smoking, but  his parents were heavy smokers. No alcohol use. No illicit drug use. He is wheelchair-bound.  Family History:  Family History  Problem Relation Age of Onset  . Cancer Mother   . Cancer Father     throat cancer     Review of Systems - unable to obtain due to his mental status  Physical Examination  Filed Vitals:   09/11/14 1400 09/11/14 1509 09/11/14 1530 09/11/14 1600  BP: 135/69  113/96 151/79  Pulse: 72  78 81  Temp:  99.1 F (37.3 C)    TempSrc:  Rectal    Resp: 26  15 29  SpO2: 93%  96% 96%    BP 151/79 mmHg  Pulse 81  Temp(Src) 99.1 F (37.3 C) (Rectal)  Resp 29  SpO2 96%  General appearance: Lethargic but easily arousable. Somewhat distracted at times. Not fully cooperative with examination. appears stated age, no distress and moderately obese Head: Normocephalic, without obvious abnormality, atraumatic Eyes: conjunctivae/corneas clear. PERRL, EOM's intact.  Throat: lips, mucosa, and tongue normal; teeth and gums normal Neck: no adenopathy, no carotid bruit, no JVD, supple, symmetrical, trachea midline and thyroid not enlarged, symmetric, no tenderness/mass/nodules Resp: Decreased air entry at the bases. Coarse breath sounds. No crackles. No wheezing. No rhonchi. Cardio: regular rate and rhythm, S1, S2 normal, no murmur, click, rub or gallop GI: soft, non-tender; bowel sounds normal; no masses,  no organomegaly Extremities: Minimal pedal edema bilaterally. No erythema. Pulses: 2+ and symmetric Skin: Skin color, texture, turgor normal. No rashes or lesions Lymph nodes: Cervical, supraclavicular, and axillary nodes normal. Neurologic: No obvious focal neurological deficits, although he does have mental disability. No facial asymmetry. Moving both his upper extremities. He does not really move his legs according to family  Laboratory Data: Results for orders placed or performed during the hospital encounter of 09/11/14 (from the past 48 hour(s))  Pro b natriuretic  peptide     Status: None   Collection Time: 09/11/14  2:18 PM  Result Value Ref Range   Pro B Natriuretic peptide (BNP) 64.9 0 - 125 pg/mL  Comprehensive metabolic panel     Status: Abnormal   Collection Time: 09/11/14  2:19 PM  Result Value Ref Range   Sodium 140 137 - 147 mEq/L   Potassium 4.4 3.7 - 5.3 mEq/L   Chloride 91 (L) 96 - 112 mEq/L   CO2 43 (HH) 19 - 32 mEq/L    Comment: CRITICAL RESULT CALLED TO, READ BACK BY AND VERIFIED WITH: G MONTAGON,RN 1514 09/11/14 WBOND    Glucose, Bld 117 (H) 70 - 99 mg/dL   BUN 12 6 - 23 mg/dL   Creatinine, Ser 0.50 0.50 - 1.35 mg/dL   Calcium 9.2 8.4 - 10.5 mg/dL   Total Protein 8.7 (H) 6.0 - 8.3 g/dL   Albumin 3.5 3.5 - 5.2 g/dL   AST 26 0 - 37 U/L   ALT 19 0 - 53 U/L   Alkaline Phosphatase 80 39 - 117 U/L     Total Bilirubin 0.4 0.3 - 1.2 mg/dL   GFR calc non Af Amer >90 >90 mL/min   GFR calc Af Amer >90 >90 mL/min    Comment: (NOTE) The eGFR has been calculated using the CKD EPI equation. This calculation has not been validated in all clinical situations. eGFR's persistently <90 mL/min signify possible Chronic Kidney Disease.    Anion gap 6 5 - 15  CBC     Status: Abnormal   Collection Time: 09/11/14  2:19 PM  Result Value Ref Range   WBC 8.6 4.0 - 10.5 K/uL   RBC 4.80 4.22 - 5.81 MIL/uL   Hemoglobin 15.6 13.0 - 17.0 g/dL   HCT 48.8 39.0 - 52.0 %   MCV 101.7 (H) 78.0 - 100.0 fL   MCH 32.5 26.0 - 34.0 pg   MCHC 32.0 30.0 - 36.0 g/dL   RDW 13.8 11.5 - 15.5 %   Platelets 120 (L) 150 - 400 K/uL  Urinalysis, Routine w reflex microscopic     Status: Abnormal   Collection Time: 09/11/14  3:11 PM  Result Value Ref Range   Color, Urine YELLOW YELLOW   APPearance HAZY (A) CLEAR   Specific Gravity, Urine 1.014 1.005 - 1.030   pH 7.5 5.0 - 8.0   Glucose, UA NEGATIVE NEGATIVE mg/dL   Hgb urine dipstick NEGATIVE NEGATIVE   Bilirubin Urine NEGATIVE NEGATIVE   Ketones, ur NEGATIVE NEGATIVE mg/dL   Protein, ur NEGATIVE NEGATIVE mg/dL    Urobilinogen, UA 1.0 0.0 - 1.0 mg/dL   Nitrite NEGATIVE NEGATIVE   Leukocytes, UA NEGATIVE NEGATIVE    Comment: MICROSCOPIC NOT DONE ON URINES WITH NEGATIVE PROTEIN, BLOOD, LEUKOCYTES, NITRITE, OR GLUCOSE <1000 mg/dL.    Radiology Reports: Dg Chest Portable 1 View  09/11/2014   CLINICAL DATA:  58-year-old male with elevated CO2 levels today, presenting with generalized malaise and tremors.  EXAM: PORTABLE CHEST - 1 VIEW  COMPARISON:  Chest x-ray 06/13/2005.  FINDINGS: Extensive airspace consolidation in the left lower lobe and lingula, concerning for multilobar pneumonia. Cephalization of the pulmonary vasculature with slight indistinctness of the interstitial markings. Cardiomegaly. Upper mediastinal contours are distorted by patient's rotation to the right, but there is left hilar prominence. Possible small left pleural effusion.  IMPRESSION: 1. The appearance the chest suggests multilobar pneumonia, most severe in the left lower lobe and lingula. Repeat standing PA and lateral chest radiographs in 2-3 weeks after trial of antimicrobial therapy is strongly recommended to ensure resolution of these findings (i.e., to exclude a centrally obstructing lesion). 2. In addition, there is evidence of at least pulmonary venous congestion, if not mild interstitial pulmonary edema.   Electronically Signed   By: Daniel  Entrikin M.D.   On: 09/11/2014 15:39    Problem List  Principal Problem:   HCAP (healthcare-associated pneumonia) Active Problems:   Atrial fibrillation   Congestive heart failure   Intellectual disability   Obstructive sleep apnea   Acute respiratory failure with hypercapnia   Chronic respiratory failure   DM type 2 (diabetes mellitus, type 2)   Seizure disorder   Assessment: This is a 58-year-old Caucasian male who presents with decreased level of consciousness and worsening shortness of breath over the last 3 days. X-ray shows worsening pneumonia. He does not have any focal  neurological deficits. He does have chronic respiratory failure with the chronic retention of CO2. ABG shows a pH of 7.38 with a PCO2 of 82, PaO2 of 74.  Plan: #1 Healthcare associated pneumonia: He'll be   treated with broad-spectrum antibiotics with vancomycin and cefepime. He'll be given oxygen. Nebulizer treatments as needed. Blood cultures have been obtained. Send urine for strep and legionella. He will require repeat chest x-ray in 4-6 weeks. The x-ray also raised a concern for vascular congestion without gross interstitial edema. Does not appear to have CHF at this time, although he does have mild pedal edema. We'll order an echocardiogram.  #2 Acute encephalopathy: Most likely due to his respiratory issues. There are no focal neurological findings. Does not need any neuroimaging at this time. If he does not improve as anticipated, one may be considered.  #3 acute respiratory failure with hypercapnia: Even though his pH appears to be well compensated increased CO2 still could be contributing to his presentation. We will still utilize BiPAP for this evening and tonight. Repeat ABG in the morning. Apparently is not very compliant with CPAP for sleep apnea.  #4 history of congestive heart failure, unspecified type: No echocardiogram reports in the chart. There is a nuclear stress test from 2006 which showed normal systolic function. He will get an echocardiogram while he is hospitalized here. He is not on diuretics at home. We will hold off on any diuretics at this time. Monitor him clinically.   #5 history of chronic atrial fibrillation: Continue with his rate control medications. He is only on aspirin and not on anticoagulation.  #6 history of obstructive sleep apnea: Appears to be noncompliant with his CPAP. BiPAP here for now. Can be transitioned to CPAP QHS once he is improved.  #7 history of diabetes mellitus type 2 on insulin: He'll be placed on sliding scale coverage. He'll be given Lantus  at a lower dose. Check HbA1c.  #8 History of seizure disorder: Continue with Tegretol and primidone. No reports of seizure activity recently.  #9 History of from mental retardation/intellectual disability: This is since birth. He has limited mobility and gets around in a wheelchair. However, is able to communicate reasonably well at baseline.   DVT Prophylaxis: Heparin Code Status: This was discussed with Ms. Alcario Drought, who is his guardian. He is DO NOT RESUSCITATE Family Communication: Discussed with his guardian  Disposition Plan: Admit to stepdown unit for BiPAP and decreased level of consciousness.  Further management decisions will depend on results of further testing and patient's response to treatment.   [facility]  [facility] Pager [phone]  If 7PM-7AM, please contact night-coverage [email or web address] Password [email or web address]  09/11/2014, 4:32 PM

## 2014-09-11 NOTE — Progress Notes (Signed)
ANTIBIOTIC CONSULT NOTE - INITIAL  Pharmacy Consult for vanc/cefepime Indication: pneumonia  No Known Allergies  Patient Measurements:  Body Weight: 120kg  Vital Signs: Temp: 99.1 F (37.3 C) (12/04 1509) Temp Source: Rectal (12/04 1509) BP: 135/69 mmHg (12/04 1400) Pulse Rate: 72 (12/04 1400) Intake/Output from previous day:   Intake/Output from this shift:    Labs:  Recent Labs  09/11/14 1419  WBC 8.6  HGB 15.6  PLT 120*  CREATININE 0.50   CrCl cannot be calculated (Unknown ideal weight.). No results for input(s): VANCOTROUGH, VANCOPEAK, VANCORANDOM, GENTTROUGH, GENTPEAK, GENTRANDOM, TOBRATROUGH, TOBRAPEAK, TOBRARND, AMIKACINPEAK, AMIKACINTROU, AMIKACIN in the last 72 hours.   Microbiology: No results found for this or any previous visit (from the past 720 hour(s)).  Medical History: Past Medical History  Diagnosis Date  . Unspecified intellectual disabilities   . Excitative type psychosis   . CHF (congestive heart failure)   . Chronic respiratory failure   . Atrial fibrillation   . Obstructive sleep apnea (adult) (pediatric)   . Diabetes mellitus without complication   . Edema   . Hyperlipidemia   . Hypertension   . Hypopotassemia   . Seizures   . GERD (gastroesophageal reflux disease)   . Unspecified constipation   . Obesity   . Femoral neck fracture     Medications:   (Not in a hospital admission) Scheduled:   Assessment: 58 yo from NH presented with SOB. Vanc and cefepime have been ordered for PNA. Baseline scr is ok.   Goal of Therapy:  Vancomycin trough level 15-20 mcg/ml  Plan:   Vanc 1g x1 in the ED then will start 1.5g IV q12 Cefepime 2g IV q12 F/u with trough if needed  Ulyses SouthwardMinh Lakeesha Fontanilla, PharmD Pager: [phone] 09/11/2014 4:05 PM

## 2014-09-11 NOTE — ED Notes (Signed)
Attempted IV x 1 with no success, second RN to attempt.

## 2014-09-11 NOTE — ED Notes (Signed)
[facility] and increased c02 level in lab work. Pt at baseline per ems. Requesting to watch tv. Appears in nad

## 2014-09-11 NOTE — ED Provider Notes (Addendum)
CSN: [id number]     Arrival date & time 09/11/14  1237 History   First MD Initiated Contact with Patient 09/11/14 1503     Chief Complaint  Patient presents with  . Shortness of Breath   Level V caveat patientt mentally developmentally disabled.  (Consider location/radiation/quality/duration/timing/severity/associated sxs/prior Treatment) Patient is a 58 y.o. male presenting with shortness of breath.  Shortness of Breath  History is obtained from his sister and guardian Russ Halo and from records accompany patient and from paramedics. Patient has been less responsive for approximately the past 3 days. Noted to have shallow breathing. EMS reports that his CO2 was 90 today while at skilled nursing facility he was felt to have CO2 narcosis with neurologic symptoms. Patient recently treated for pneumonia with Avelox. Per Ms. Julian Reil patient normally alert and interactive with other people. Patient diagnosed with pneumonia on 09/08/2014. Treated with  on Avelox on 12/ 1. /2015 Past Medical History  Diagnosis Date  . Unspecified intellectual disabilities   . Excitative type psychosis   . CHF (congestive heart failure)   . Chronic respiratory failure   . Atrial fibrillation   . Obstructive sleep apnea (adult) (pediatric)   . Diabetes mellitus without complication   . Edema   . Hyperlipidemia   . Hypertension   . Hypopotassemia   . Seizures   . GERD (gastroesophageal reflux disease)   . Unspecified constipation   . Obesity   . Femoral neck fracture    No past surgical history on file. Family History  Problem Relation Age of Onset  . Cancer Mother   . Cancer Father     throat cancer   History  Substance Use Topics  . Smoking status: Unknown If Ever Smoked  . Smokeless tobacco: Not on file  . Alcohol Use: No    Review of Systems  Unable to perform ROS Respiratory: Positive for shortness of breath.    altered mental status    Allergies  Review of patient's allergies  indicates no known allergies.  Home Medications   Prior to Admission medications   Medication Sig Start Date End Date Taking? Authorizing Provider  aspirin 81 MG chewable tablet Take 1 tablet by mouth daily for A-Fib  * use house stock*   Yes Historical Provider, MD  carbamazepine (TEGRETOL) 200 MG tablet Take 300 mg by mouth 3 (three) times daily. 1 and 1/2 tablets =300mg    Yes Historical Provider, MD  diltiazem (CARDIZEM) 120 MG tablet Take 1 capsule by mouth daily for HTN   Yes Historical Provider, MD  insulin aspart (NOVOLOG) 100 UNIT/ML injection Inject into the skin. Check BS before meals if 100-150=5 units, if 151 and greater = 10 units  * No Bedtime Dose* for DM *Do Not mix with other insulins*  Expires 28 days after opening.  *check expiration date*   Yes Historical Provider, MD  insulin glargine (LANTUS) 100 UNIT/ML injection Inject 70 units subcutaneously every night at bedtime for DM  *Do Not Mix with other Insulins* expires 28 days after opening.  *check expiration date*   Yes Historical Provider, MD  metoprolol tartrate (LOPRESSOR) 25 MG tablet Take 75 mg by mouth 2 (two) times daily. **Hold for pulse </=50 and SBP</= 100**   Yes Historical Provider, MD  pioglitazone (ACTOS) 30 MG tablet Take 30 mg by mouth daily.    Yes Historical Provider, MD  primidone (MYSOLINE) 50 MG tablet Take 200 mg by mouth 3 (three) times daily.    Yes Historical Provider,  MD  ranitidine (ZANTAC) 75 MG tablet Take 75 mg by mouth at bedtime. For GERD   Yes Historical Provider, MD  sennosides-docusate sodium (SENOKOT-S) 8.6-50 MG tablet Take 1 tabley by mouth every night at bedtime for constipation  * Use House Stock*   Yes Historical Provider, MD  traMADol (ULTRAM) 50 MG tablet Take 1 tablet by mouth every 4 hours as needed for pain 04/22/14  Yes Kimber RelicArthur G Green, MD  Vitamin D, Ergocalciferol, (DRISDOL) 50000 UNITS CAPS capsule Take 50,000 Units by mouth every 7 (seven) days. MONDAY   Yes Historical Provider, MD    BP 135/69 mmHg  Pulse 72  Temp(Src) 99.1 F (37.3 C) (Rectal)  Resp 26  SpO2 93% Physical Exam  Constitutional:  Chronically ill-appearing area sleepy arousable to tactile stimulus.  HENT:  Head: Normocephalic and atraumatic.  Eyes: Conjunctivae are normal. Pupils are equal, round, and reactive to light.  Neck: Neck supple. No tracheal deviation present. No thyromegaly present.  Cardiovascular: Normal rate and regular rhythm.   No murmur heard. Pulmonary/Chest: Effort normal and breath sounds normal.  Abdominal: Soft. Bowel sounds are normal. He exhibits no distension. There is no tenderness.  Obese  Musculoskeletal: Normal range of motion. He exhibits no edema or tenderness.  Neurological: He is alert. No cranial nerve deficit.  Moves all extremities  Skin: Skin is warm and dry. No rash noted.  Psychiatric: He has a normal mood and affect.  Nursing note and vitals reviewed.   ED Course  Procedures (including critical care time) Labs Review Labs Reviewed  COMPREHENSIVE METABOLIC PANEL - Abnormal; Notable for the following:    Chloride 91 (*)    CO2 43 (*)    Glucose, Bld 117 (*)    Total Protein 8.7 (*)    All other components within normal limits  CBC - Abnormal; Notable for the following:    MCV 101.7 (*)    Platelets 120 (*)    All other components within normal limits  PRO B NATRIURETIC PEPTIDE  URINALYSIS, ROUTINE W REFLEX MICROSCOPIC  BLOOD GAS, ARTERIAL  PRO B NATRIURETIC PEPTIDE    Imaging Review No results found.   EKG Interpretation None     Results for orders placed or performed during the hospital encounter of 09/11/14  Pro b natriuretic peptide  Result Value Ref Range   Pro B Natriuretic peptide (BNP) 64.9 0 - 125 pg/mL  Comprehensive metabolic panel  Result Value Ref Range   Sodium 140 137 - 147 mEq/L   Potassium 4.4 3.7 - 5.3 mEq/L   Chloride 91 (L) 96 - 112 mEq/L   CO2 43 (HH) 19 - 32 mEq/L   Glucose, Bld 117 (H) 70 - 99 mg/dL   BUN  12 6 - 23 mg/dL   Creatinine, Ser 5.620.50 0.50 - 1.35 mg/dL   Calcium 9.2 8.4 - 13.010.5 mg/dL   Total Protein 8.7 (H) 6.0 - 8.3 g/dL   Albumin 3.5 3.5 - 5.2 g/dL   AST 26 0 - 37 U/L   ALT 19 0 - 53 U/L   Alkaline Phosphatase 80 39 - 117 U/L   Total Bilirubin 0.4 0.3 - 1.2 mg/dL   GFR calc non Af Amer >90 >90 mL/min   GFR calc Af Amer >90 >90 mL/min   Anion gap 6 5 - 15  CBC  Result Value Ref Range   WBC 8.6 4.0 - 10.5 K/uL   RBC 4.80 4.22 - 5.81 MIL/uL   Hemoglobin 15.6 13.0 - 17.0  g/dL   HCT 16.148.8 09.639.0 - 04.552.0 %   MCV 101.7 (H) 78.0 - 100.0 fL   MCH 32.5 26.0 - 34.0 pg   MCHC 32.0 30.0 - 36.0 g/dL   RDW 40.913.8 81.111.5 - 91.415.5 %   Platelets 120 (L) 150 - 400 K/uL  Urinalysis, Routine w reflex microscopic  Result Value Ref Range   Color, Urine YELLOW YELLOW   APPearance HAZY (A) CLEAR   Specific Gravity, Urine 1.014 1.005 - 1.030   pH 7.5 5.0 - 8.0   Glucose, UA NEGATIVE NEGATIVE mg/dL   Hgb urine dipstick NEGATIVE NEGATIVE   Bilirubin Urine NEGATIVE NEGATIVE   Ketones, ur NEGATIVE NEGATIVE mg/dL   Protein, ur NEGATIVE NEGATIVE mg/dL   Urobilinogen, UA 1.0 0.0 - 1.0 mg/dL   Nitrite NEGATIVE NEGATIVE   Leukocytes, UA NEGATIVE NEGATIVE   Dg Chest Portable 1 View  09/11/2014   CLINICAL DATA:  58 year old male with elevated CO2 levels today, presenting with generalized malaise and tremors.  EXAM: PORTABLE CHEST - 1 VIEW  COMPARISON:  Chest x-ray 06/13/2005.  FINDINGS: Extensive airspace consolidation in the left lower lobe and lingula, concerning for multilobar pneumonia. Cephalization of the pulmonary vasculature with slight indistinctness of the interstitial markings. Cardiomegaly. Upper mediastinal contours are distorted by patient's rotation to the right, but there is left hilar prominence. Possible small left pleural effusion.  IMPRESSION: 1. The appearance the chest suggests multilobar pneumonia, most severe in the left lower lobe and lingula. Repeat standing PA and lateral chest  radiographs in 2-3 weeks after trial of antimicrobial therapy is strongly recommended to ensure resolution of these findings (i.e., to exclude a centrally obstructing lesion). 2. In addition, there is evidence of at least pulmonary venous congestion, if not mild interstitial pulmonary edema.   Electronically Signed   By: Trudie Reedaniel  Entrikin M.D.   On: 09/11/2014 15:39   Blood gas pH 7.38 PCO2 82 PO2 74 consistent with a chronic compensated respiratory acidosis MDM  I had lengthy discussion with Ms. Julian ReilGardner. Patient is DO NOT RESUSCITATE CODE STATUS Final diagnoses:  SOB (shortness of breath)   patient given only 1 L of normal saline as bolus as well as has history of CHF. Blood gas consistent with a chronic respiratory acidosis is compensated Patient to pretreated with for healthcare associated pneumonia given nursing home status Spoke with Dr.G.Krishnon who will arrange for inpatient stay. Diagnosis healthcare associated pneumonia     Doug SouSam Minami Arriaga, MD 09/11/14 [id number]  Doug SouSam Brayam Boeke, MD 09/11/14 [phone]

## 2014-09-12 ENCOUNTER — Inpatient Hospital Stay (HOSPITAL_COMMUNITY): Payer: Medicare Other

## 2014-09-12 DIAGNOSIS — G4733 Obstructive sleep apnea (adult) (pediatric): Secondary | ICD-10-CM

## 2014-09-12 DIAGNOSIS — I517 Cardiomegaly: Secondary | ICD-10-CM

## 2014-09-12 DIAGNOSIS — G9340 Encephalopathy, unspecified: Secondary | ICD-10-CM

## 2014-09-12 LAB — BLOOD GAS, ARTERIAL
Acid-Base Excess: 13.6 mmol/L — ABNORMAL HIGH (ref 0.0–2.0)
BICARBONATE: 39.7 meq/L — AB (ref 20.0–24.0)
Drawn by: 405301
O2 Content: 2 L/min
O2 Saturation: 95.4 %
PATIENT TEMPERATURE: 98.2
TCO2: 42 mmol/L (ref 0–100)
pCO2 arterial: 73.2 mmHg (ref 35.0–45.0)
pH, Arterial: 7.352 (ref 7.350–7.450)
pO2, Arterial: 80.7 mmHg (ref 80.0–100.0)

## 2014-09-12 LAB — COMPREHENSIVE METABOLIC PANEL
ALT: 18 U/L (ref 0–53)
AST: 26 U/L (ref 0–37)
Albumin: 3.1 g/dL — ABNORMAL LOW (ref 3.5–5.2)
Alkaline Phosphatase: 71 U/L (ref 39–117)
Anion gap: 9 (ref 5–15)
BILIRUBIN TOTAL: 0.6 mg/dL (ref 0.3–1.2)
BUN: 14 mg/dL (ref 6–23)
CHLORIDE: 93 meq/L — AB (ref 96–112)
CO2: 35 mEq/L — ABNORMAL HIGH (ref 19–32)
Calcium: 9 mg/dL (ref 8.4–10.5)
Creatinine, Ser: 0.44 mg/dL — ABNORMAL LOW (ref 0.50–1.35)
GFR calc Af Amer: >90 mL/min (ref >90)
Glucose, Bld: 108 mg/dL — ABNORMAL HIGH (ref 70–99)
Potassium: 4.3 mEq/L (ref 3.7–5.3)
Sodium: 137 mEq/L (ref 137–147)
Total Protein: 7.9 g/dL (ref 6.0–8.3)

## 2014-09-12 LAB — STREP PNEUMONIAE URINARY ANTIGEN: Strep Pneumo Urinary Antigen: NEGATIVE

## 2014-09-12 LAB — CBC
HCT: 47.8 % (ref 39.0–52.0)
Hemoglobin: 15.2 g/dL (ref 13.0–17.0)
MCH: 32.5 pg (ref 26.0–34.0)
MCHC: 31.8 g/dL (ref 30.0–36.0)
MCV: 102.4 fL — AB (ref 78.0–100.0)
PLATELETS: 113 10*3/uL — AB (ref 150–400)
RBC: 4.67 MIL/uL (ref 4.22–5.81)
RDW: 14 % (ref 11.5–15.5)
WBC: 6.8 10*3/uL (ref 4.0–10.5)

## 2014-09-12 LAB — HEMOGLOBIN A1C
Hgb A1c MFr Bld: 6 % — ABNORMAL HIGH (ref <5.7)
MEAN PLASMA GLUCOSE: 126 mg/dL — AB (ref <117)

## 2014-09-12 LAB — HIV ANTIBODY (ROUTINE TESTING W REFLEX): HIV 1&2 Ab, 4th Generation: NONREACTIVE

## 2014-09-12 LAB — GLUCOSE, CAPILLARY
GLUCOSE-CAPILLARY: 110 mg/dL — AB (ref 70–99)
GLUCOSE-CAPILLARY: 152 mg/dL — AB (ref 70–99)
GLUCOSE-CAPILLARY: 89 mg/dL (ref 70–99)
Glucose-Capillary: 118 mg/dL — ABNORMAL HIGH (ref 70–99)
Glucose-Capillary: 127 mg/dL — ABNORMAL HIGH (ref 70–99)

## 2014-09-12 NOTE — Progress Notes (Signed)
09/12/14 Patient coming to unit from [facility] to room 5W18 Dx of HCAP,IV site Lt A/c NS KVO,Diet DYS 3,CBG ACHS,IV abt.

## 2014-09-12 NOTE — Progress Notes (Signed)
  Echocardiogram 2D Echocardiogram has been performed.  Arvil ChacoFoster, Courtney Bellizzi 09/12/2014, 9:47 AM

## 2014-09-12 NOTE — Evaluation (Signed)
Clinical/Bedside Swallow Evaluation Patient Details  Name: Tony Terry Notarianni MRN: [id number] Date of Birth: 30-Jul-1956  Today's Date: 09/12/2014 Time: 0245-0305 SLP Time Calculation (min) (ACUTE ONLY): 20 min  Past Medical History:  Past Medical History  Diagnosis Date  . Unspecified intellectual disabilities   . Excitative type psychosis   . CHF (congestive heart failure)   . Chronic respiratory failure   . Atrial fibrillation   . Obstructive sleep apnea (adult) (pediatric)   . Diabetes mellitus without complication   . Edema   . Hyperlipidemia   . Hypertension   . Hypopotassemia   . Seizures   . GERD (gastroesophageal reflux disease)   . Unspecified constipation   . Obesity   . Femoral neck fracture    Past Surgical History: History reviewed. No pertinent past surgical history. HPI:  Pt is a 58 y.o. male with a past medical history significant for mental retardation, congestive heart failure of unknown type, chronic respiratory failure, atrial fibrillation, obstructive sleep apnea, diabetes mellitus type 2, seizure disorder who lives in a skilled nursing facility and was brought in on 12/4 due to decreased level of consciousness and worsening shortness of breath. Apparently, his carbon dioxide level was high according to EMS reports on 12/4.Pt with diagnosis of Pna on 12/1 and now exhibits worsening dyspnea.    Assessment / Plan / Recommendation Clinical Impression   Pt with overt s/s of aspiration with thin liquids via large sips including multiple swallows, but eliminated with small sips via straw and positioning changes during BSE; pt was lethargic and minimally uncooperative during BSE which also impacted results of BSE; delayed cough x1 with solid consistency noted as wel as decreased masticationl; pt able to consume puree, small sips of thin and mechanical soft consistency without difficulty; recommend f/u with full meal of Dysphagia 3/thin with small sips via straw to address  impulsivity with meals; meds crushed in puree recommended to reduce aspiration risk     Aspiration Risk  Mild    Diet Recommendation Dysphagia 3 (Mechanical Soft);Thin liquid   Liquid Administration via: Cup Medication Administration: Whole meds with puree Supervision: Staff to assist with self feeding Compensations: Slow rate;Small sips/bites Postural Changes and/or Swallow Maneuvers: Seated upright 90 degrees    Other  Recommendations   n/a  Follow Up Recommendations  Skilled Nursing facility;Other (comment) (f/u prn)    Frequency and Duration min 1 x/week  1 week   Pertinent Vitals/Pain WDL    SLP Swallow Goals  see POC   Swallow Study Prior Functional Status   Resident at SNF with assistance with ADL's    General Date of Onset: 09/11/14 HPI: Pt is a 58 y.o. male with a past medical history significant for mental retardation, congestive heart failure of unknown type, chronic respiratory failure, atrial fibrillation, obstructive sleep apnea, diabetes mellitus type 2, seizure disorder who lives in a skilled nursing facility and was brought in on 12/4 due to decreased level of consciousness and worsening shortness of breath. Apparently, his carbon dioxide level was high according to EMS reports on 12/4.Pt with diagnosis of Pna on 12/1 and now exhibits worsening dyspnea.  Type of Study: Bedside swallow evaluation Previous Swallow Assessment: na Diet Prior to this Study: Dysphagia 3 (soft);Thin liquids Temperature Spikes Noted: No Respiratory Status: Nasal cannula History of Recent Intubation: No Behavior/Cognition: Lethargic;Requires cueing Oral Cavity - Dentition: Missing dentition Self-Feeding Abilities: Needs assist;Needs set up Patient Positioning: Upright in bed Baseline Vocal Quality: Low vocal intensity;Clear Volitional Cough: Cognitively unable to  elicit Volitional Swallow: Unable to elicit    Oral/Motor/Sensory Function Overall Oral Motor/Sensory Function: Other  (comment) (u/a to complete d/t decreased cognition) Labial Symmetry: Within Functional Limits Lingual Symmetry: Within Functional Limits   Ice Chips Ice chips: Not tested   Thin Liquid Thin Liquid: Impaired Presentation: Straw;Cup Pharyngeal  Phase Impairments: Multiple swallows;Other (comments) (with straw sips intermittently)    Nectar Thick Nectar Thick Liquid: Not tested   Honey Thick Honey Thick Liquid: Not tested   Puree Puree: Within functional limits Presentation: Spoon   Solid       Solid: Impaired Presentation: Spoon Oral Phase Impairments: Impaired mastication Pharyngeal Phase Impairments: Multiple swallows;Cough - Delayed       [place]akai Yard,PAT, M.S., CCC-SLP 09/12/2014,3:49 PM

## 2014-09-12 NOTE — Progress Notes (Signed)
Report was called to IberiaRobyn, RN in [facility] west. Pt is stable for transport.   Caralee AtesJason Licia Harl, RN

## 2014-09-12 NOTE — Progress Notes (Signed)
[facility] PROGRESS NOTE  Tony CoriaMark Terry ZOX:[id number] DOB: 01/26/1956 DOA: 09/11/2014  PCP: Angela Coxasanayaka, Gayani Y, MD  Brief HPI: Tony CoriaMark Terry is a 58 y.o. male with a past medical history of mental retardation, congestive heart failure of unknown type, chronic respiratory failure, atrial fibrillation, obstructive sleep apnea, diabetes mellitus type 2, seizure disorder who lives in a skilled nursing facility and was brought in due to decreased level of consciousness and worsening shortness of breath. He was noted to have hypercapnic respiratory failure. He apparently is noncompliant with his CPAP. He was also noted to have multilobar pneumonia. He was subsequently admitted to the hospital.   Past medical history:  Past Medical History  Diagnosis Date  . Unspecified intellectual disabilities   . Excitative type psychosis   . CHF (congestive heart failure)   . Chronic respiratory failure   . Atrial fibrillation   . Obstructive sleep apnea (adult) (pediatric)   . Diabetes mellitus without complication   . Edema   . Hyperlipidemia   . Hypertension   . Hypopotassemia   . Seizures   . GERD (gastroesophageal reflux disease)   . Unspecified constipation   . Obesity   . Femoral neck fracture     Consultants: None  Procedures:  2D ECHO Pending  Antibiotics: Vanc and Cefepime 12/4-->  Subjective: Patient is much more alert this morning. However it remains difficult to understand him due to his underlying mental disability. Remains unclear if he is back to baseline. No family at bedside.  Objective: Vital Signs  Filed Vitals:   09/12/14 0213 09/12/14 0356 09/12/14 0409 09/12/14 0841  BP:  131/72  162/73  Pulse: 62 65 64   Temp:  99 F (37.2 C)  98.5 F (36.9 C)  TempSrc:  Axillary  Axillary  Resp: 23 20 14    Height:      Weight:  114.2 kg (251 lb 12.3 oz)    SpO2: 92% 93% 94%     Intake/Output Summary (Last 24 hours) at 09/12/14 1053 Last data filed at 09/12/14  0847  Gross per 24 hour  Intake   1773 ml  Output    485 ml  Net   1288 ml   Filed Weights   09/11/14 1700 09/12/14 0356  Weight: 114.3 kg (251 lb 15.8 oz) 114.2 kg (251 lb 12.3 oz)    General appearance: alert, distracted, no distress and moderately obese Head: Normocephalic, without obvious abnormality, atraumatic Resp: clear to auscultation bilaterally Cardio: regular rate and rhythm, S1, S2 normal, no murmur, click, rub or gallop GI: soft, non-tender; bowel sounds normal; no masses,  no organomegaly Extremities: extremities normal, atraumatic, no cyanosis or edema Neurologic: No facial asymmetry. Moving both his upper extremities. Moving his right leg. However, I couldn't get him to move his left leg despite multiple attempts.  Lab Results:  Basic Metabolic Panel:  Recent Labs Lab 09/11/14 1419 09/12/14 0430  NA 140 137  K 4.4 4.3  CL 91* 93*  CO2 43* 35*  GLUCOSE 117* 108*  BUN 12 14  CREATININE 0.50 0.44*  CALCIUM 9.2 9.0   Liver Function Tests:  Recent Labs Lab 09/11/14 1419 09/12/14 0430  AST 26 26  ALT 19 18  ALKPHOS 80 71  BILITOT 0.4 0.6  PROT 8.7* 7.9  ALBUMIN 3.5 3.1*   CBC:  Recent Labs Lab 09/11/14 1419 09/12/14 0430  WBC 8.6 6.8  HGB 15.6 15.2  HCT 48.8 47.8  MCV 101.7* 102.4*  PLT 120* 113*  BNP (last 3 results)  Recent Labs  09/11/14 1418  PROBNP 64.9   CBG:  Recent Labs Lab 09/11/14 2308 09/12/14 0843  GLUCAP 100* 110*    Recent Results (from the past 240 hour(s))  Blood Culture (routine x 2)     Status: None (Preliminary result)   Collection Time: 09/11/14  4:45 PM  Result Value Ref Range Status   Specimen Description BLOOD ARM LEFT  Final   Special Requests BOTTLES DRAWN AEROBIC AND ANAEROBIC 5CC  Final   Culture  Setup Time   Final    09/11/2014 22:25 Performed at [facility]    Culture   Final           BLOOD CULTURE RECEIVED NO GROWTH TO DATE CULTURE WILL BE HELD FOR 5 DAYS BEFORE ISSUING A FINAL  NEGATIVE REPORT Performed at [facility]    Report Status PENDING  Incomplete  Blood Culture (routine x 2)     Status: None (Preliminary result)   Collection Time: 09/11/14  4:48 PM  Result Value Ref Range Status   Specimen Description BLOOD ARM RIGHT  Final   Special Requests BOTTLES DRAWN AEROBIC AND ANAEROBIC 5CC  Final   Culture  Setup Time   Final    09/11/2014 22:27 Performed at [facility]    Culture   Final           BLOOD CULTURE RECEIVED NO GROWTH TO DATE CULTURE WILL BE HELD FOR 5 DAYS BEFORE ISSUING A FINAL NEGATIVE REPORT Performed at [facility]    Report Status PENDING  Incomplete  MRSA PCR Screening     Status: None   Collection Time: 09/11/14  5:47 PM  Result Value Ref Range Status   MRSA by PCR NEGATIVE NEGATIVE Final    Comment:        The GeneXpert MRSA Assay (FDA approved for NASAL specimens only), is one component of a comprehensive MRSA colonization surveillance program. It is not intended to diagnose MRSA infection nor to guide or monitor treatment for MRSA infections.       Studies/Results: Dg Chest Portable 1 View  09/11/2014   CLINICAL DATA:  58 year old male with elevated CO2 levels today, presenting with generalized malaise and tremors.  EXAM: PORTABLE CHEST - 1 VIEW  COMPARISON:  Chest x-ray 06/13/2005.  FINDINGS: Extensive airspace consolidation in the left lower lobe and lingula, concerning for multilobar pneumonia. Cephalization of the pulmonary vasculature with slight indistinctness of the interstitial markings. Cardiomegaly. Upper mediastinal contours are distorted by patient's rotation to the right, but there is left hilar prominence. Possible small left pleural effusion.  IMPRESSION: 1. The appearance the chest suggests multilobar pneumonia, most severe in the left lower lobe and lingula. Repeat standing PA and lateral chest radiographs in 2-3 weeks after trial of antimicrobial therapy is strongly recommended to  ensure resolution of these findings (i.e., to exclude a centrally obstructing lesion). 2. In addition, there is evidence of at least pulmonary venous congestion, if not mild interstitial pulmonary edema.   Electronically Signed   By: Trudie Reed M.D.   On: 09/11/2014 15:39    Medications:  Scheduled: . aspirin  81 mg Oral Daily  . carbamazepine  300 mg Oral TID  . ceFEPime (MAXIPIME) IV  2 g Intravenous Q12H  . diltiazem  120 mg Oral Daily  . heparin  5,000 Units Subcutaneous 3 times per day  . insulin aspart  0-15 Units Subcutaneous TID WC  . insulin aspart  0-5 Units Subcutaneous QHS  . insulin glargine  35 Units Subcutaneous QHS  . metoprolol tartrate  75 mg Oral BID  . primidone  200 mg Oral TID  . sodium chloride  3 mL Intravenous Q12H  . vancomycin  1,500 mg Intravenous Q12H   Continuous: . sodium chloride 10 mL/hr (09/11/14 1800)   ZOX:[id number]:acetaminophen **OR** acetaminophen, levalbuterol, ondansetron **OR** ondansetron (ZOFRAN) IV  Assessment/Plan:  Principal Problem:   HCAP (healthcare-associated pneumonia) Active Problems:   Atrial fibrillation   Congestive heart failure   Intellectual disability   Obstructive sleep apnea   Acute respiratory failure with hypercapnia   Chronic respiratory failure   DM type 2 (diabetes mellitus, type 2)   Seizure disorder   Acute encephalopathy    Healthcare Associated Pneumonia Continue with broad-spectrum antibiotics. Await culture data. There was a concern raised about mild interstitial edema. Echocardiogram is pending. A swallow evaluation would be beneficial and that is also pending at this time. Since he is more awake, alert, we will put him on a dysphagia 3 diet.  Acute encephalopathy He appears to be improved. This was most likely due to his respiratory issues. There are no focal neurological findings. But he is noted not to move his left leg. It is unclear if this is a new finding or old as he is wheelchair bound. Will  obtain CT head.   Acute respiratory failure with hypercapnia He appears to have chronic respiratory failure with chronic retention of CO2. BiPAP was utilized overnight. His mentation appears to have improved. He has been off of BiPAP for the last 3 hours and he is doing quite well. ABG from this morning looks improved better. Continue to watch him for a few more hours off the BiPAP and if he remains stable, he should be able to go to a regular floor. He will need to continue using her C Pap every night for his history of sleep apnea. He apparently was being noncompliant with this.  History of congestive heart failure, unspecified type Appears reasonably well compensated. Echocardiogram is pending. No old echocardiogram reports in the chart. There is a nuclear stress test from 2006 which showed normal systolic function. He is not on diuretics at home. X-ray was more suggestive of pneumonia rather than interstitial edema. Severe holding off on diuretics for now.   History of chronic atrial fibrillation Continue with his rate control medications. He is only on aspirin and not on anticoagulation.  History of obstructive sleep apnea Apparently he is noncompliant with his CPAP. Can be transitioned to CPAP QHS once he is improved.  History of diabetes mellitus type 2 on insulin Continue with the lower dose Lantus and sliding scale coverage. CBGs stable. HbA1c is 6.0.  History of seizure disorder Continue with Tegretol and primidone. No reports of seizure activity recently.  History of mental retardation/intellectual disability This is since birth. He has limited mobility and gets around in a wheelchair. However, is able to communicate reasonably well with family at baseline.   DVT Prophylaxis: Heparin Code Status: This was discussed with Ms. Julian ReilGardner, who is his guardian. He is DO NOT RESUSCITATE Family Communication: No family at bedside today Disposition Plan: Can be transferred to the for later  today if he remains stable.    LOS: 1 day   [facility] HospitalKRISHNAN,Tony Terry  [facility] Pager [phone] 09/12/2014, 10:53 AM  If 8PM-8AM, please contact night-coverage at [email or web address], password [email or web address]

## 2014-09-12 NOTE — Progress Notes (Signed)
Patient is on BIPAP 18/6, 30% FIO2.  Patient is tolerating well at this time.

## 2014-09-13 ENCOUNTER — Inpatient Hospital Stay (HOSPITAL_COMMUNITY): Payer: Medicare Other

## 2014-09-13 LAB — BLOOD GAS, ARTERIAL
Acid-Base Excess: 12.2 mmol/L — ABNORMAL HIGH (ref 0.0–2.0)
Bicarbonate: 39 mEq/L — ABNORMAL HIGH (ref 20.0–24.0)
DELIVERY SYSTEMS: POSITIVE
DRAWN BY: 283381
EXPIRATORY PAP: 6
Inspiratory PAP: 12
O2 CONTENT: 5 L/min
O2 Saturation: 76.5 %
PATIENT TEMPERATURE: 98.6
PH ART: 7.292 — AB (ref 7.350–7.450)
TCO2: 41.6 mmol/L (ref 0–100)
pCO2 arterial: 83.3 mmHg (ref 35.0–45.0)
pO2, Arterial: 0 mmHg — CL (ref 80.0–100.0)

## 2014-09-13 LAB — URINE CULTURE
Colony Count: NO GROWTH
Culture: NO GROWTH

## 2014-09-13 LAB — CBC
HEMATOCRIT: 47.7 % (ref 39.0–52.0)
Hemoglobin: 15 g/dL (ref 13.0–17.0)
MCH: 31.8 pg (ref 26.0–34.0)
MCHC: 31.4 g/dL (ref 30.0–36.0)
MCV: 101.1 fL — ABNORMAL HIGH (ref 78.0–100.0)
Platelets: 113 10*3/uL — ABNORMAL LOW (ref 150–400)
RBC: 4.72 MIL/uL (ref 4.22–5.81)
RDW: 13.9 % (ref 11.5–15.5)
WBC: 5.2 10*3/uL (ref 4.0–10.5)

## 2014-09-13 LAB — POCT I-STAT 3, ART BLOOD GAS (G3+)
Acid-Base Excess: 10 mmol/L — ABNORMAL HIGH (ref 0.0–2.0)
Bicarbonate: 39.6 mEq/L — ABNORMAL HIGH (ref 20.0–24.0)
O2 SAT: 92 %
PO2 ART: 74 mmHg — AB (ref 80.0–100.0)
Patient temperature: 98.6
TCO2: 42 mmol/L (ref 0–100)
pCO2 arterial: 77.3 mmHg (ref 35.0–45.0)
pH, Arterial: 7.318 — ABNORMAL LOW (ref 7.350–7.450)

## 2014-09-13 LAB — BASIC METABOLIC PANEL
Anion gap: 8 (ref 5–15)
BUN: 13 mg/dL (ref 6–23)
CO2: 35 mEq/L — ABNORMAL HIGH (ref 19–32)
CREATININE: 0.39 mg/dL — AB (ref 0.50–1.35)
Calcium: 8.8 mg/dL (ref 8.4–10.5)
Chloride: 95 mEq/L — ABNORMAL LOW (ref 96–112)
GFR calc Af Amer: >90 mL/min (ref >90)
Glucose, Bld: 118 mg/dL — ABNORMAL HIGH (ref 70–99)
POTASSIUM: 4.6 meq/L (ref 3.7–5.3)
Sodium: 138 mEq/L (ref 137–147)

## 2014-09-13 LAB — GLUCOSE, CAPILLARY
Glucose-Capillary: 114 mg/dL — ABNORMAL HIGH (ref 70–99)
Glucose-Capillary: 96 mg/dL (ref 70–99)
Glucose-Capillary: 90 mg/dL (ref 70–99)

## 2014-09-13 MED ORDER — FUROSEMIDE 10 MG/ML IJ SOLN
40.0000 mg | Freq: Once | INTRAMUSCULAR | Status: AC
  Administered 2014-09-13: 40 mg via INTRAVENOUS
  Filled 2014-09-13: qty 4

## 2014-09-13 NOTE — Progress Notes (Signed)
PT Cancellation Note  Patient Details Name: Carlene CoriaMark Kluth MRN: [id number] DOB: 07-13-1956   Cancelled Treatment:    Reason Eval/Treat Not Completed: Patient at procedure or test/unavailable.  Will reattempt mobility evaluation as able and make recommendations, however based on hx in chart expect return to SNF at d/c. Thanks,    Dennis BastMartin, Bayler Gehrig Galloway 09/13/2014, 12:08 PM

## 2014-09-13 NOTE — Plan of Care (Signed)
Problem: Consults Goal: Pneumonia Patient Education See Patient Educatio Module for education specifics.  Outcome: Progressing Goal: Skin Care Protocol Initiated - if Braden Score 18 or less If consults are not indicated, leave blank or document N/A  Outcome: Completed/Met Date Met:  09/13/14 Goal: Nutrition Consult-if indicated Outcome: Completed/Met Date Met:  09/13/14 Goal: Diabetes Guidelines if Diabetic/Glucose > 140 If diabetic or lab glucose is > 140 mg/dl - Initiate Diabetes/Hyperglycemia Guidelines & Document Interventions  Outcome: Completed/Met Date Met:  09/13/14  Problem: Phase II Progression Outcomes Goal: Encourage coughing & deep breathing Outcome: Progressing Goal: Wean O2 if indicated Outcome: Progressing Goal: Pain controlled Outcome: Progressing Goal: Review culture results with MD if needed Outcome: Progressing Goal: Progress activity as tolerated unless otherwise ordered Outcome: Progressing Goal: Discharge plan established Outcome: Progressing Goal: Tolerating diet Outcome: Progressing Goal: If HF, initiate HF Core Reminder Form Outcome: Progressing Goal: Other Phase II Outcomes/Goals Outcome: Progressing

## 2014-09-13 NOTE — Progress Notes (Addendum)
Attempted to wake pt to give oral medications.  Pt did not respond to voice or touch.  Sternal rub given and pt grimaced, but did not open eyes.  While on CPAP at 2L, oxygen saturations initially 91%, but quickly dropped to 74% and did not recover during a period of apnea. Paged repsiratory, rapid response, and Dr. Rito EhrlichKrishnan.  Pt placed on non-rebreather and ABG drawn.  Dr. Rito EhrlichKrishnan to come see pt.  Will continue to monitor.

## 2014-09-13 NOTE — Progress Notes (Signed)
[facility] PROGRESS NOTE  Tony Terry ONG:[id number] DOB: 16-Nov-1955 DOA: 09/11/2014  PCP: Tony Cox, MD  Brief HPI: Tony Terry is a 58 y.o. male with a past medical history of mental retardation, congestive heart failure of unknown type, chronic respiratory failure, atrial fibrillation, obstructive sleep apnea, diabetes mellitus type 2, seizure disorder who lives in a skilled nursing facility and was brought in due to decreased level of consciousness and worsening shortness of breath. He was noted to have hypercapnic respiratory failure. He apparently is noncompliant with his CPAP. He was also noted to have multilobar pneumonia. He was subsequently admitted to the hospital.   Past medical history:  Past Medical History  Diagnosis Date  . Unspecified intellectual disabilities   . Excitative type psychosis   . CHF (congestive heart failure)   . Chronic respiratory failure   . Atrial fibrillation   . Obstructive sleep apnea (adult) (pediatric)   . Diabetes mellitus without complication   . Edema   . Hyperlipidemia   . Hypertension   . Hypopotassemia   . Seizures   . GERD (gastroesophageal reflux disease)   . Unspecified constipation   . Obesity   . Femoral neck fracture     Consultants: None  Procedures:  2D ECHO Pending  Antibiotics: Vanc and Cefepime 12/4-->  Subjective: Patient is alert and arousable but tends to go back to sleep. Unfortunately he did not wear his CPAP overnight. RN explained the importance of compliance. Otherwise it is difficult to communicate with him.  Objective: Vital Signs  Filed Vitals:   09/12/14 1658 09/12/14 2121 09/13/14 0612 09/13/14 0616  BP: 143/67 152/83 162/82 158/70  Pulse: 77 89 91   Temp: 97.5 F (36.4 C) 98.6 F (37 C) 98.5 F (36.9 C)   TempSrc: Oral Oral Oral   Resp: 17 16 18    Height: 5\' 2"  (1.575 m)     Weight: 115.8 kg (255 lb 4.7 oz)     SpO2: 96% 96% 94%     Intake/Output Summary (Last 24  hours) at 09/13/14 0737 Last data filed at 09/12/14 2040  Gross per 24 hour  Intake 733.33 ml  Output    810 ml  Net -76.67 ml   Filed Weights   09/11/14 1700 09/12/14 0356 09/12/14 1658  Weight: 114.3 kg (251 lb 15.8 oz) 114.2 kg (251 lb 12.3 oz) 115.8 kg (255 lb 4.7 oz)    General appearance: alert, distracted, no distress and moderately obese Resp: clear to auscultation bilaterally Cardio: regular rate and rhythm, S1, S2 normal, no murmur, click, rub or gallop GI: soft, non-tender; bowel sounds normal; no masses,  no organomegaly Extremities: extremities normal, atraumatic, no cyanosis or edema Neurologic: No facial asymmetry. Moving both his upper extremities. Moving his right leg. Doesn't follow commands which makes it difficult to examine. But today he was noted to move his left leg.   Lab Results:  Basic Metabolic Panel:  Recent Labs Lab 09/11/14 1419 09/12/14 0430  NA 140 137  K 4.4 4.3  CL 91* 93*  CO2 43* 35*  GLUCOSE 117* 108*  BUN 12 14  CREATININE 0.50 0.44*  CALCIUM 9.2 9.0   Liver Function Tests:  Recent Labs Lab 09/11/14 1419 09/12/14 0430  AST 26 26  ALT 19 18  ALKPHOS 80 71  BILITOT 0.4 0.6  PROT 8.7* 7.9  ALBUMIN 3.5 3.1*   CBC:  Recent Labs Lab 09/11/14 1419 09/12/14 0430  WBC 8.6 6.8  HGB 15.6 15.2  HCT 48.8 47.8  MCV 101.7* 102.4*  PLT 120* 113*   BNP (last 3 results)  Recent Labs  09/11/14 1418  PROBNP 64.9   CBG:  Recent Labs Lab 09/12/14 0843 09/12/14 1212 09/12/14 1621 09/12/14 1728 09/12/14 2157  GLUCAP 110* 118* 152* 127* 89    Recent Results (from the past 240 hour(s))  Urine culture     Status: None   Collection Time: 09/11/14  3:11 PM  Result Value Ref Range Status   Specimen Description URINE, CATHETERIZED  Final   Special Requests ADDED [phone]  Final   Culture  Setup Time   Final    09/12/2014 01:32 [facility]formed at [facility]    Colony Count NO GROWTH [facility]formed at [facility]   Final   Culture NO GROWTH [facility]formed [facility]   Final   Report Status 09/13/2014 FINAL  Final  Blood Culture (routine x 2)     Status: None (Preliminary result)   Collection Time: 09/11/14  4:45 PM  Result Value Ref Range Status   Specimen Description BLOOD ARM LEFT  Final   Special Requests BOTTLES DRAWN AEROBIC AND ANAEROBIC 5CC  Final   Culture  Setup Time   Final    09/11/2014 22:25 [facility]formed at [facility]    Culture   Final           BLOOD CULTURE RECEIVED NO GROWTH TO DATE CULTURE WILL BE HELD FOR 5 DAYS BEFORE ISSUING A FINAL NEGATIVE REPORT [facility]formed at [facility]    Report Status PENDING  Incomplete  Blood Culture (routine x 2)     Status: None (Preliminary result)   Collection Time: 09/11/14  4:48 PM  Result Value Ref Range Status   Specimen Description BLOOD ARM RIGHT  Final   Special Requests BOTTLES DRAWN AEROBIC AND ANAEROBIC 5CC  Final   Culture  Setup Time   Final    09/11/2014 22:27 [facility]formed at [facility]    Culture   Final           BLOOD CULTURE RECEIVED NO GROWTH TO DATE CULTURE WILL BE HELD FOR 5 DAYS BEFORE ISSUING A FINAL NEGATIVE REPORT [facility]formed at [facility]    Report Status PENDING  Incomplete  MRSA PCR Screening     Status: None   Collection Time: 09/11/14  5:47 PM  Result Value Ref Range Status   MRSA by PCR NEGATIVE NEGATIVE Final    Comment:        The GeneXpert MRSA Assay (FDA approved for NASAL specimens only), is one component of a comprehensive MRSA colonization surveillance program. It is not intended to diagnose MRSA infection nor to guide or monitor treatment for MRSA infections.       Studies/Results: Ct Head Wo Contrast  09/12/2014   CLINICAL DATA:  Shortness of breath and increased C02 levels. History of developmental disorder.  EXAM: CT HEAD WITHOUT CONTRAST  TECHNIQUE: Contiguous axial images were obtained from the base of the skull through the vertex  without intravenous contrast.  COMPARISON:  Head CT 06/17/2013.  FINDINGS: There is no evidence of acute intracranial hemorrhage, mass lesion, brain edema or extra-axial fluid collection. The ventricles and subarachnoid spaces are appropriately sized for age. There is no CT evidence of acute cortical infarction.  The visualized paranasal sinuses, mastoid air cells and middle ears are clear. Calvarial hyperostosis noted. No acute osseous findings demonstrated.  IMPRESSION: Stable noncontrast head CT.  No acute  intracranial findings.   Electronically Signed   By: Roxy HorsemanBill  Veazey M.D.   On: 09/12/2014 12:10   Dg Chest Portable 1 View  09/11/2014   CLINICAL DATA:  58 year old male with elevated CO2 levels today, presenting with generalized malaise and tremors.  EXAM: PORTABLE CHEST - 1 VIEW  COMPARISON:  Chest x-ray 06/13/2005.  FINDINGS: Extensive airspace consolidation in the left lower lobe and lingula, concerning for multilobar pneumonia. Cephalization of the pulmonary vasculature with slight indistinctness of the interstitial markings. Cardiomegaly. Upper mediastinal contours are distorted by patient's rotation to the right, but there is left hilar prominence. Possible small left pleural effusion.  IMPRESSION: 1. The appearance the chest suggests multilobar pneumonia, most severe in the left lower lobe and lingula. Repeat standing PA and lateral chest radiographs in 2-3 weeks after trial of antimicrobial therapy is strongly recommended to ensure resolution of these findings (i.e., to exclude a centrally obstructing lesion). 2. In addition, there is evidence of at least pulmonary venous congestion, if not mild interstitial pulmonary edema.   Electronically Signed   By: Trudie Reedaniel  Entrikin M.D.   On: 09/11/2014 15:39    Medications:  Scheduled: . aspirin  81 mg Oral Daily  . carbamazepine  300 mg Oral TID  . ceFEPime (MAXIPIME) IV  2 g Intravenous Q12H  . diltiazem  120 mg Oral Daily  . heparin  5,000 Units  Subcutaneous 3 times per day  . insulin aspart  0-15 Units Subcutaneous TID WC  . insulin aspart  0-5 Units Subcutaneous QHS  . insulin glargine  35 Units Subcutaneous QHS  . metoprolol tartrate  75 mg Oral BID  . primidone  200 mg Oral TID  . sodium chloride  3 mL Intravenous Q12H  . vancomycin  1,500 mg Intravenous Q12H   Continuous: . sodium chloride 10 mL/hr at 09/12/14 1000   BJY:[id number]:acetaminophen **OR** acetaminophen, levalbuterol, ondansetron **OR** ondansetron (ZOFRAN) IV  Assessment/Plan:  Principal Problem:   HCAP (healthcare-associated pneumonia) Active Problems:   Atrial fibrillation   Congestive heart failure   Intellectual disability   Obstructive sleep apnea   Acute respiratory failure with hypercapnia   Chronic respiratory failure   DM type 2 (diabetes mellitus, type 2)   Seizure disorder   Acute encephalopathy    Healthcare Associated Pneumonia Continue with broad-spectrum antibiotics. Blood culture negative so far. Continue broad spectrum coverage for now. Anticipate change to oral agents in AM. There was a concern raised about mild interstitial edema. Echocardiogram is pending. Swallow evaluation was done. Continue Dys 3 diet.  Acute encephalopathy Improved from admission but unclear if he is back to baseline. CT head was unremarkable for acute events. This was probably due to respiratory failure.  Acute respiratory failure with hypercapnia He appears to have chronic respiratory failure with chronic retention of CO2. BiPAP was utilized overnight on day of admission. His mentation appears to have improved. He was subsequently transferred to the floor. However, unfortunately he did not wear the CPAP last night. Discussed with RN and emphasized importance of being compliant with this to avoid recurrence of hypercapnia.   History of congestive heart failure, unspecified type Appears reasonably well compensated. Echocardiogram is pending. No old echocardiogram  reports in the chart. There is a nuclear stress test from 2006 which showed normal systolic function. He is not on diuretics at home. X-ray was more suggestive of pneumonia rather than interstitial edema. We are holding off diuretics for now.   History of chronic atrial fibrillation Continue with his rate  control medications. He is only on aspirin and not on anticoagulation.  History of obstructive sleep apnea Apparently he is noncompliant with his CPAP at the skilled nursing facility. This will need to be addressed there.  History of diabetes mellitus type 2 on insulin Continue with lower dose Lantus and sliding scale coverage. CBGs stable. HbA1c is 6.0.  History of seizure disorder Continue with Tegretol and primidone. No reports of seizure activity recently.  History of mental retardation/intellectual disability This is since birth. He has limited mobility and gets around in a wheelchair. However, is able to communicate reasonably well with family at baseline.   DVT Prophylaxis: Heparin Code Status: This was discussed with Ms. Julian Reil, who is his guardian, at admission. He is DO NOT RESUSCITATE Family Communication: No family at bedside today Disposition Plan: OOB to chair.    LOS: 2 days   [facility]  [facility] Pager [phone] 09/13/2014, 7:37 AM  If 8PM-8AM, please contact night-coverage at [email or web address], password [facility]

## 2014-09-13 NOTE — Progress Notes (Addendum)
Called by RN as Patient unresponsive.   Awakes slightly only with sternal rub. Sats also dropped to 80's. HR and BP normal.  Lungs reveal poor air entry at bases. No obvious crackles or wheezing.  ABG shows worsening respiratory acidosis. As noted earlier in my progress note he did not wear his CPAP overnight which could have contributed.  Discussed in detail with family/guardian. They maintain DNR status but would like to try Bipap.  Will transfer to stepdown and use Bipap.  Stat CXR.  If he worsens despite this, no further escalation of care. Will just keep him comfortable.  Mazell Aylesworth 5:18 PM 09/13/2014

## 2014-09-13 NOTE — Progress Notes (Signed)
Patient could not communicate about CPAP.  Talked to RN and made the decision that he would be agitated by the mask and machine.

## 2014-09-13 NOTE — Progress Notes (Signed)
Changes made per RT after abg, rt awaiting dr to call.

## 2014-09-13 NOTE — Progress Notes (Signed)
Report called.  Pt in transport with family at bedside.

## 2014-09-13 NOTE — Significant Event (Signed)
Rapid Response Event Note  Overview: Time Called: 1640 Arrival Time: 1650 Event Type: Respiratory  Initial Focused Assessment:  Called by RN for patient unresponsive and stats noted to be in the 70's.  Had been placed on his CPAP machine a little while ago, Patient was taken off that and placed on NRB and blood gas ordered as per protocol.  Upon my arrival to patients room, Rn at bedside, Patient O2 sat 92%, placed back on cpap.  Patietn minimally responsive to painful stimuli and has snoring respirations.     Interventions:  ABG done, MD at bedside.   Event Summary: RN to call if assistance needed   at      at          [facility], Maryagnes Amosenise Ann

## 2014-09-14 ENCOUNTER — Inpatient Hospital Stay (HOSPITAL_COMMUNITY): Payer: Medicare Other

## 2014-09-14 LAB — CBC
HCT: 45.6 % (ref 39.0–52.0)
Hemoglobin: 14.5 g/dL (ref 13.0–17.0)
MCH: 31.9 pg (ref 26.0–34.0)
MCHC: 31.8 g/dL (ref 30.0–36.0)
MCV: 100.2 fL — ABNORMAL HIGH (ref 78.0–100.0)
Platelets: 99 10*3/uL — ABNORMAL LOW (ref 150–400)
RBC: 4.55 MIL/uL (ref 4.22–5.81)
RDW: 13.6 % (ref 11.5–15.5)
WBC: 4.9 10*3/uL (ref 4.0–10.5)

## 2014-09-14 LAB — POCT I-STAT 3, ART BLOOD GAS (G3+)
Acid-Base Excess: 10 mmol/L — ABNORMAL HIGH (ref 0.0–2.0)
Acid-Base Excess: 10 mmol/L — ABNORMAL HIGH (ref 0.0–2.0)
Bicarbonate: 39.6 mEq/L — ABNORMAL HIGH (ref 20.0–24.0)
Bicarbonate: 38.5 mEq/L — ABNORMAL HIGH (ref 20.0–24.0)
O2 SAT: 95 %
O2 Saturation: 96 %
PCO2 ART: 73.8 mmHg — AB (ref 35.0–45.0)
PCO2 ART: 66.5 mmHg — AB (ref 35.0–45.0)
PH ART: 7.337 — AB (ref 7.350–7.450)
PH ART: 7.37 (ref 7.350–7.450)
Patient temperature: 98.6
Patient temperature: 98.6
TCO2: 42 mmol/L (ref 0–100)
TCO2: 40 mmol/L (ref 0–100)
pO2, Arterial: 87 mmHg (ref 80.0–100.0)
pO2, Arterial: 89 mmHg (ref 80.0–100.0)

## 2014-09-14 LAB — BASIC METABOLIC PANEL
ANION GAP: 8 (ref 5–15)
BUN: 15 mg/dL (ref 6–23)
CALCIUM: 8.4 mg/dL (ref 8.4–10.5)
CO2: 36 mEq/L — ABNORMAL HIGH (ref 19–32)
Chloride: 95 mEq/L — ABNORMAL LOW (ref 96–112)
Creatinine, Ser: 0.45 mg/dL — ABNORMAL LOW (ref 0.50–1.35)
GFR calc Af Amer: >90 mL/min (ref >90)
GLUCOSE: 93 mg/dL (ref 70–99)
Potassium: 4.6 mEq/L (ref 3.7–5.3)
Sodium: 139 mEq/L (ref 137–147)

## 2014-09-14 LAB — AMMONIA: AMMONIA: 107 umol/L — AB (ref 11–60)

## 2014-09-14 LAB — GLUCOSE, CAPILLARY
GLUCOSE-CAPILLARY: 87 mg/dL (ref 70–99)
Glucose-Capillary: 96 mg/dL (ref 70–99)

## 2014-09-14 LAB — LEGIONELLA ANTIGEN, URINE

## 2014-09-14 MED ORDER — CHLORHEXIDINE GLUCONATE 0.12 % MT SOLN
15.0000 mL | Freq: Two times a day (BID) | OROMUCOSAL | Status: DC
  Administered 2014-09-14 – 2014-09-16 (×5): 15 mL via OROMUCOSAL
  Filled 2014-09-14 (×4): qty 15

## 2014-09-14 MED ORDER — INSULIN ASPART 100 UNIT/ML ~~LOC~~ SOLN
0.0000 [IU] | Freq: Three times a day (TID) | SUBCUTANEOUS | Status: DC
  Administered 2014-09-15: 2 [IU] via SUBCUTANEOUS
  Administered 2014-09-15: 3 [IU] via SUBCUTANEOUS
  Administered 2014-09-17 – 2014-09-18 (×3): 2 [IU] via SUBCUTANEOUS

## 2014-09-14 MED ORDER — LEVOFLOXACIN IN D5W 500 MG/100ML IV SOLN
500.0000 mg | INTRAVENOUS | Status: DC
  Administered 2014-09-14 – 2014-09-18 (×5): 500 mg via INTRAVENOUS
  Filled 2014-09-14 (×6): qty 100

## 2014-09-14 MED ORDER — CETYLPYRIDINIUM CHLORIDE 0.05 % MT LIQD
7.0000 mL | Freq: Two times a day (BID) | OROMUCOSAL | Status: DC
  Administered 2014-09-14 – 2014-09-17 (×7): 7 mL via OROMUCOSAL

## 2014-09-14 MED ORDER — RESOURCE THICKENUP CLEAR PO POWD
ORAL | Status: DC | PRN
  Filled 2014-09-14: qty 125

## 2014-09-14 MED ORDER — STARCH (THICKENING) PO POWD
ORAL | Status: DC | PRN
  Filled 2014-09-14: qty 227

## 2014-09-14 NOTE — Progress Notes (Signed)
At midnight rounds, RN spoke with Rt about taking pt off bipap shortly before Rt arrived.  Rt will continnue to be available for pt and bipap needs.

## 2014-09-14 NOTE — Progress Notes (Signed)
Nutrition Brief Note  Patient identified on the Malnutrition Screening Tool (MST) Report  Wt Readings from Last 15 Encounters:  09/14/14 247 lb 5.7 oz (112.2 kg)  08/10/14 264 lb 12.8 oz (120.112 kg)  03/26/14 267 lb 3.2 oz (121.201 kg)  01/28/14 264 lb 3.2 oz (119.84 kg)  12/10/13 266 lb 8 oz (120.884 kg)  06/17/13 255 lb (115.667 kg)  04/16/13 260 lb 12.8 oz (118.298 kg)   Tony Terry is a 58 y.o. male with a past medical history of for mental retardation, congestive heart failure of unknown type, chronic respiratory failure, atrial fibrillation, obstructive sleep apnea, diabetes mellitus type 2, seizure disorder who lives in a skilled nursing facility and was brought in today due to decreased level of consciousness and worsening shortness of breath.   Pt unable to provide hx. Per SLP, pt with some impulsivity at meals. Noted breakfast tray has not yet been attempted. UBW ranges from 255-265. Pt with hx of CHF and wt changes likely related to fluid.  Nutrition focused physical exam revealed no signs of fat and muscle depletion.   Body mass index is 45.23 kg/(m^2). Patient meets criteria for extreme obesity, class II based on current BMI.   Current diet order is Dysphagia 3, patient is consuming approximately n/a% of meals at this time. Labs and medications reviewed.   No nutrition interventions warranted at this time. If nutrition issues arise, please consult RD.   Tony Terry A. Mayford KnifeWilliams, RD, LDN Pager: [phone] After hours Pager: [phone]

## 2014-09-14 NOTE — Progress Notes (Signed)
Pt with expired PASARR number. Social security number on file incorrect. [facility] and confirmed this number with SN[facility] and PASARR system. Correct number provided to admissions and changed. CSW submitted for pt new PASARR. Pt will likely require level 2 screening. Pt family made aware that this process could potentially impact pt discharge but should not impact pt [facility].  Boysie Bonebrake, LCSWA [phone]

## 2014-09-14 NOTE — Evaluation (Signed)
Occupational Therapy Evaluation Patient Details Name: Tony CoriaMark Terry MRN: [id number] DOB: 26-Feb-1956 Today's Date: 09/14/2014    History of Present Illness 58 y.o. male with a past medical history of mental retardation, congestive heart failure of unknown type, chronic respiratory failure, atrial fibrillation, obstructive sleep apnea, diabetes mellitus type 2, seizure disorder who lives in a skilled nursing facility and was brought in due to decreased level of consciousness and worsening shortness of breath. He was noted to have hypercapnic respiratory failure. Pt found to have PNA and placed on bipap.   Clinical Impression   Pt admitted with above. He demonstrates the below listed deficits and will benefit from continued OT to maximize safety and independence with BADLs.  Pt presents to OT with tremor bil. UE; decreased arousal; generalized weakness and worsened mental status.  Currently, he requires max - total A with BADLs which is a significant change from his baseline.  [facility], and [facility].  Will follow acutely.         Follow Up Recommendations  SNF    Equipment Recommendations  None recommended by OT    Recommendations for Other Services       Precautions / Restrictions Precautions Precautions: Fall      Mobility Bed Mobility Overal bed mobility: Needs Assistance Bed Mobility: Sit to Sidelying;Rolling;Sidelying to Sit Rolling: +2 for physical assistance;Total assist Sidelying to sit: +2 for physical assistance;Total assist     Sit to sidelying: +2 for physical assistance;Total assist General bed mobility comments: Assist to bring legs off of bed and bring trunk up into sitting. Pt assisting to roll by grasping rail with RUE but didn't release rail as coming into sitting making it more difficult.  Transfers                 General transfer comment: unable    Balance Overall balance assessment: Needs  assistance Sitting-balance support: Bilateral upper extremity supported Sitting balance-Leahy Scale: Poor Sitting balance - Comments: Pt sat EOB x 10 minutes with supervision to min A.                                    ADL Overall ADL's : Needs assistance/impaired Eating/Feeding: Moderate assistance;Sitting Eating/Feeding Details (indicate cue type and reason): Pt with tremor bil. UEs - spilled drink.  Required min A to steady hand  Grooming: Wash/dry hands;Wash/dry face;Oral care;Total assistance;Sitting   Upper Body Bathing: Total assistance;Sitting   Lower Body Bathing: Total assistance;Bed level   Upper Body Dressing : Total assistance;Sitting   Lower Body Dressing: Total assistance;Bed level   Toilet Transfer: Total assistance (unable)   Toileting- Clothing Manipulation and Hygiene: Total assistance;Bed level       Functional mobility during ADLs: Total assistance General ADL Comments: Pt incontinent of urine.  assisted with clean up at bed level      Vision                 Additional Comments: Pt keeps eyes closed majority of the time    Perception     Praxis      Pertinent Vitals/Pain Pain Assessment: Faces Faces Pain Scale: No hurt     Hand Dominance Right   Extremity/Trunk Assessment Upper Extremity Assessment Upper Extremity Assessment: Generalized weakness;RUE deficits/detail;LUE deficits/detail RUE Deficits / Details: Pt with jerkiness/tremor bil. UEs RUE Coordination: decreased fine motor;decreased gross motor  LUE Deficits / Details: Pt with jerkiness/tremor bil. UEs LUE Coordination: decreased fine motor;decreased gross motor   Lower Extremity Assessment Lower Extremity Assessment: Defer to PT evaluation       Communication     Cognition Arousal/Alertness: Lethargic Behavior During Therapy: Flat affect Overall Cognitive Status: Impaired/Different from baseline Area of Impairment: Attention;Following commands;Problem  solving   Current Attention Level: Focused;Sustained   Following Commands: Follows one step commands inconsistently     Problem Solving: Slow processing;Requires verbal cues;Requires tactile cues General Comments: At baseline pt able to converse and make needs known.    General Comments       Exercises       Shoulder Instructions      Home Living Family/patient expects to be discharged to:: Skilled nursing facility                                 Additional Comments: [facility]      Prior Functioning/Environment Level of Independence: Needs assistance  Gait / Transfers Assistance Needed: Pt was modified independent with w/c mobility [facility] ADL's / Homemaking Assistance Needed: Pt was able to transfer to toilet, wore a diaper as he was unreliable with toileting.  He was able to assist with BADLs - his brother states "he could do it, but he liked for them to help him".  He was able to self propel w/c throughout facility.  Enjoyed Therapist, musicBingo, bowling and other games.    Comments: Pt likes bowling and likes to play bingo.    OT Diagnosis: Cognitive deficits;Generalized weakness   OT Problem List: Decreased strength;Decreased activity tolerance;Impaired balance (sitting and/or standing);Decreased cognition;Decreased safety awareness;Cardiopulmonary status limiting activity;Obesity;Impaired UE functional use   OT Treatment/Interventions: Self-care/ADL training;Therapeutic exercise;Therapeutic activities;Cognitive remediation/compensation;Patient/family education;Balance training;DME and/or AE instruction;Neuromuscular education    OT Goals(Current goals can be found in the care plan section) Acute Rehab OT Goals Patient Stated Goal: not stated OT Goal Formulation: With patient/family Time For Goal Achievement: 09/28/14 Potential to Achieve Goals: Good ADL Goals Pt Will Perform Eating: with adaptive  utensils;sitting;with supervision Pt Will Perform Grooming: with supervision;sitting Pt Will Transfer to Toilet: with min assist;bedside commode;stand pivot transfer Pt Will Perform Toileting - Clothing Manipulation and hygiene: with min assist;sit to/from stand  OT Frequency: Min 2X/week   Barriers to D/C: Decreased caregiver support  Pt resided at SNF [facility]       Co-evaluation PT/OT/SLP Co-Evaluation/Treatment: Yes Reason for Co-Treatment: Complexity of the patient's impairments (multi-system involvement) PT goals addressed during session: Mobility/safety with mobility;Balance OT goals addressed during session: ADL's and self-care      End of Session Nurse Communication: Mobility status  Activity Tolerance: Patient limited by lethargy Patient left: in bed;with call bell/phone within reach   Time: [id number] OT Time Calculation (min): 53 min Charges:  OT General Charges $OT Visit: 1 Procedure OT Evaluation $Initial OT Evaluation Tier I: 1 Procedure OT Treatments $Self Care/Home Management : 8-22 mins $Therapeutic Activity: 8-22 mins G-Codes:    Garreth Burnsworth M 09/14/2014, 5:19 PM

## 2014-09-14 NOTE — Plan of Care (Signed)
Problem: Consults Goal: Pneumonia Patient Education See Patient Educatio Module for education specifics.  Outcome: Progressing  Problem: Phase II Progression Outcomes Goal: Encourage coughing & deep breathing Outcome: Progressing Goal: Wean O2 if indicated Outcome: Progressing Goal: Pain controlled Outcome: Completed/Met Date Met:  09/14/14 Goal: Progress activity as tolerated unless otherwise ordered Outcome: Progressing

## 2014-09-14 NOTE — Progress Notes (Signed)
EEG completed, results pending. 

## 2014-09-14 NOTE — Progress Notes (Addendum)
[facility] PROGRESS NOTE  Tony CoriaMark Terry ZOX:[id number] DOB: 01/13/1956 DOA: 09/11/2014  PCP: Angela Coxasanayaka, Gayani Y, MD  Brief HPI: Tony Terry is a 58 y.o. male with a past medical history of mental retardation, congestive heart failure of unknown type, chronic respiratory failure, atrial fibrillation, obstructive sleep apnea, diabetes mellitus type 2, seizure disorder who lives in a skilled nursing facility and was brought in due to decreased level of consciousness and worsening shortness of breath. He was noted to have hypercapnic respiratory failure. He apparently is noncompliant with his CPAP. He was also noted to have multilobar pneumonia. He was subsequently admitted to the hospital.   Past medical history:  Past Medical History  Diagnosis Date  . Unspecified intellectual disabilities   . Excitative type psychosis   . CHF (congestive heart failure)   . Chronic respiratory failure   . Atrial fibrillation   . Obstructive sleep apnea (adult) (pediatric)   . Diabetes mellitus without complication   . Edema   . Hyperlipidemia   . Hypertension   . Hypopotassemia   . Seizures   . GERD (gastroesophageal reflux disease)   . Unspecified constipation   . Obesity   . Femoral neck fracture     Consultants: None  Procedures:  2D ECHO Pending  Antibiotics: Vanc and Cefepime 12/4-->12/7 Levaquin 12/7-->  Subjective: Patient is on BiPAP. He is much more alert and communicative. However it is still difficult to understand him.   Objective: Vital Signs  Filed Vitals:   09/14/14 0200 09/14/14 0300 09/14/14 0400 09/14/14 0500  BP: 137/63 145/68 146/68   Pulse: 68 71 72   Temp:  97.6 F (36.4 C)    TempSrc:  Oral    Resp: 16 21 19    Height:      Weight:    112.2 kg (247 lb 5.7 oz)  SpO2: 96% 98% 98%     Intake/Output Summary (Last 24 hours) at 09/14/14 0720 Last data filed at 09/14/14 0500  Gross per 24 hour  Intake   1180 ml  Output   1800 ml  Net   -620 ml    Filed Weights   09/12/14 0356 09/12/14 1658 09/14/14 0500  Weight: 114.2 kg (251 lb 12.3 oz) 115.8 kg (255 lb 4.7 oz) 112.2 kg (247 lb 5.7 oz)    General appearance: alert, wearing BiPAP, distracted, no distress and moderately obese Resp: clear to auscultation bilaterally Cardio: regular rate and rhythm, S1, S2 normal, no murmur, click, rub or gallop GI: soft, non-tender; bowel sounds normal; no masses,  no organomegaly Extremities: extremities normal, atraumatic, no cyanosis or edema Neurologic: No obvious focal neurological deficits. He doesn't move his lower extremities as much. Doesn't follow commands, which makes it very difficult to assess.   Lab Results:  Basic Metabolic Panel:  Recent Labs Lab 09/11/14 1419 09/12/14 0430 09/13/14 0645 09/14/14 0233  NA 140 137 138 139  K 4.4 4.3 4.6 4.6  CL 91* 93* 95* 95*  CO2 43* 35* 35* 36*  GLUCOSE 117* 108* 118* 93  BUN 12 14 13 15   CREATININE 0.50 0.44* 0.39* 0.45*  CALCIUM 9.2 9.0 8.8 8.4   Liver Function Tests:  Recent Labs Lab 09/11/14 1419 09/12/14 0430  AST 26 26  ALT 19 18  ALKPHOS 80 71  BILITOT 0.4 0.6  PROT 8.7* 7.9  ALBUMIN 3.5 3.1*   CBC:  Recent Labs Lab 09/11/14 1419 09/12/14 0430 09/13/14 0645 09/14/14 0233  WBC 8.6 6.8 5.2 4.9  HGB 15.6  15.2 15.0 14.5  HCT 48.8 47.8 47.7 45.6  MCV 101.7* 102.4* 101.1* 100.2*  PLT 120* 113* 113* 99*   BNP (last 3 results)  Recent Labs  09/11/14 1418  PROBNP 64.9   CBG:  Recent Labs Lab 09/12/14 1728 09/12/14 2157 09/13/14 0846 09/13/14 1634 09/13/14 2137  GLUCAP 127* 89 114* 96 90    Recent Results (from the past 240 hour(s))  Urine culture     Status: None   Collection Time: 09/11/14  3:11 PM  Result Value Ref Range Status   Specimen Description URINE, CATHETERIZED  Final   Special Requests ADDED [id number]  Final   Culture  Setup Time   Final    09/12/2014 01:32 Performed at [facility]    Colony Count NO  GROWTH Performed at [facility]   Final   Culture NO GROWTH Performed at [facility]   Final   Report Status 09/13/2014 FINAL  Final  Blood Culture (routine x 2)     Status: None (Preliminary result)   Collection Time: 09/11/14  4:45 PM  Result Value Ref Range Status   Specimen Description BLOOD ARM LEFT  Final   Special Requests BOTTLES DRAWN AEROBIC AND ANAEROBIC 5CC  Final   Culture  Setup Time   Final    09/11/2014 22:25 Performed at [facility]    Culture   Final           BLOOD CULTURE RECEIVED NO GROWTH TO DATE CULTURE WILL BE HELD FOR 5 DAYS BEFORE ISSUING A FINAL NEGATIVE REPORT Performed at [facility]    Report Status PENDING  Incomplete  Blood Culture (routine x 2)     Status: None (Preliminary result)   Collection Time: 09/11/14  4:48 PM  Result Value Ref Range Status   Specimen Description BLOOD ARM RIGHT  Final   Special Requests BOTTLES DRAWN AEROBIC AND ANAEROBIC 5CC  Final   Culture  Setup Time   Final    09/11/2014 22:27 Performed at [facility]    Culture   Final           BLOOD CULTURE RECEIVED NO GROWTH TO DATE CULTURE WILL BE HELD FOR 5 DAYS BEFORE ISSUING A FINAL NEGATIVE REPORT Performed at [facility]    Report Status PENDING  Incomplete  MRSA PCR Screening     Status: None   Collection Time: 09/11/14  5:47 PM  Result Value Ref Range Status   MRSA by PCR NEGATIVE NEGATIVE Final    Comment:        The GeneXpert MRSA Assay (FDA approved for NASAL specimens only), is one component of a comprehensive MRSA colonization surveillance program. It is not intended to diagnose MRSA infection nor to guide or monitor treatment for MRSA infections.       Studies/Results: Ct Head Wo Contrast  09/12/2014   CLINICAL DATA:  Shortness of breath and increased C02 levels. History of developmental disorder.  EXAM: CT HEAD WITHOUT CONTRAST  TECHNIQUE: Contiguous axial images were obtained from the base of  the skull through the vertex without intravenous contrast.  COMPARISON:  Head CT 06/17/2013.  FINDINGS: There is no evidence of acute intracranial hemorrhage, mass lesion, brain edema or extra-axial fluid collection. The ventricles and subarachnoid spaces are appropriately sized for age. There is no CT evidence of acute cortical infarction.  The visualized paranasal sinuses, mastoid air cells and middle ears are clear. Calvarial hyperostosis noted. No acute osseous findings  demonstrated.  IMPRESSION: Stable noncontrast head CT.  No acute intracranial findings.   Electronically Signed   By: Roxy Horseman M.D.   On: 09/12/2014 12:10   Mr Brain Wo Contrast  09/13/2014   CLINICAL DATA:  New onset Altered level of consciousness for this 58 year old male with mental retardation, atrial fibrillation, diabetes, and seizures. Hypercapnic respiratory failure. Initial encounter.  EXAM: MRI HEAD WITHOUT CONTRAST  TECHNIQUE: Multiplanar, multiecho pulse sequences of the brain and surrounding structures were obtained without intravenous contrast.  COMPARISON:  CT head 09/12/2014.  FINDINGS: The patient was unable to remain motionless for the exam. Small or subtle lesions could be overlooked.  No evidence for acute infarction, hemorrhage, mass lesion, hydrocephalus, or extra-axial fluid. Mild atrophy. Minor white matter signal abnormality, nonspecific. Flow voids are maintained throughout the carotid, basilar, and vertebral arteries. No midline abnormality. Small microbleed LEFT posterior temporal subcortical white matter likely chronic, of doubtful significance. Flow voids are maintained. Extracranial soft tissues grossly unremarkable.  IMPRESSION: Motion degraded exam.  No acute intracranial abnormality.  Mild atrophy.   Electronically Signed   By: Davonna Belling M.D.   On: 09/13/2014 13:16   Dg Chest Port 1 View  09/13/2014   CLINICAL DATA:  Hypoxia  EXAM: PORTABLE CHEST - 1 VIEW  COMPARISON:  09/11/2014  FINDINGS: There is  patchy lingular and left lower lobe airspace disease. There is no pleural effusion or pneumothorax. Right lung is clear. There is stable cardiomegaly there is prominence of the pulmonary vasculature. There is no acute osseous abnormality.  IMPRESSION: Patchy lingular and left lower lobe airspace disease which may reflect multifocal atelectasis versus pneumonia.   Electronically Signed   By: Elige Ko   On: 09/13/2014 17:42    Medications:  Scheduled: . aspirin  81 mg Oral Daily  . carbamazepine  300 mg Oral TID  . diltiazem  120 mg Oral Daily  . insulin aspart  0-15 Units Subcutaneous TID WC  . insulin aspart  0-5 Units Subcutaneous QHS  . insulin glargine  35 Units Subcutaneous QHS  . levofloxacin (LEVAQUIN) IV  500 mg Intravenous Q24H  . metoprolol tartrate  75 mg Oral BID  . primidone  200 mg Oral TID  . sodium chloride  3 mL Intravenous Q12H   Continuous: . sodium chloride 10 mL/hr at 09/12/14 1000   ZOX:[id number] **OR** acetaminophen, levalbuterol, ondansetron **OR** ondansetron (ZOFRAN) IV  Assessment/Plan:  Principal Problem:   HCAP (healthcare-associated pneumonia) Active Problems:   Atrial fibrillation   Congestive heart failure   Intellectual disability   Obstructive sleep apnea   Acute respiratory failure with hypercapnia   Chronic respiratory failure   DM type 2 (diabetes mellitus, type 2)   Seizure disorder   Acute encephalopathy    Acute respiratory failure with hypercapnia He decompensated yesterday afternoon. He had to be transferred back to stepdown unit. He was placed again on BiPAP. His ABG shows improvement this morning. We will try to take him off of the BiPAP and see how he does. If he continues to require BiPAP continuously despite adequate treatment of the pneumonia . We will need to discuss this further with the family. He also has chronic respiratory failure with chronic retention of CO2.   Healthcare Associated Pneumonia Blood cultures are  negative. We will change to Levaquin. There was a concern raised about mild interstitial edema on the initial x-ray, but not in the subsequent one. Echocardiogram is pending. Swallow evaluation was done. Continue Dys 3 diet.  Acute encephalopathy His mental status improves whenever he is adequately ventilated. But whenever he is off of the BiPAP or CPAP he tends to become groggy. CT head was unremarkable. Due to concerns about left-sided facial droop yesterday, MRI was obtained which did not show any acute events. I think his presentation is mostly due to his respiratory failure.   History of congestive heart failure, unspecified type Appears reasonably well compensated. Echocardiogram is pending. No old echocardiogram reports in the chart. There is a nuclear stress test from 2006 which showed normal systolic function. He is not on diuretics at home. X-ray was more suggestive of pneumonia rather than interstitial edema. We are holding off diuretics for now.   History of chronic atrial fibrillation Continue with his rate control medications. He is only on aspirin and not on anticoagulation.  History of obstructive sleep apnea Apparently he is noncompliant with his CPAP at the skilled nursing facility.   History of diabetes mellitus type 2 on insulin Continue with lower dose Lantus and sliding scale coverage. CBGs stable. HbA1c is 6.0.  History of seizure disorder Continue with Tegretol and primidone. No reports of seizure activity recently.  History of mental retardation/intellectual disability This is since birth. He has limited mobility and gets around in a wheelchair. However, is able to communicate reasonably well with family at baseline.  Thrombocytopenia. We will stop his heparin. Monitor counts closely.  DVT Prophylaxis: SCDs Code Status: DO NOT RESUSCITATE Family Communication: Long discussion with family yesterday. We will contact them again today. Disposition Plan: OOB to chair  as tolerated. Likely will return to skilled nursing facility when he improves.  ADDENDUM Patient declined quickly once bipap was removed. It was reapplied. It has become difficult to wean him off of bipap. Discussed this difficult situation with family. Will get Pulmonology input. If there are no other solutions, we may need to pursue palliative care/hospice.  Daysi Boggan 2:48 PM    LOS: 3 days   [facility]ISHNAN,Jin Shockley  [facility] Pager [phone] 09/14/2014, 7:20 AM  If 8PM-8AM, please contact night-coverage at [email or web address], password [email or web address]

## 2014-09-14 NOTE — Progress Notes (Signed)
Pt placed on CPAP QHS with 10 cm H2O through full face mask.  Pt is comfortable and stable.

## 2014-09-14 NOTE — Clinical Social Work Psychosocial (Signed)
     Clinical Social Work Department BRIEF PSYCHOSOCIAL ASSESSMENT 09/14/2014  Patient:  Tony Terry,Tony Terry     Account Number:  [id number]     Admit date:  09/11/2014  Clinical Social Worker:  Harless NakayamaAMBELAL,[place]elle [place], LCSWA  Date/Time:  09/14/2014 01:17 PM  Referred by:  Physician  Date Referred:  09/14/2014 Referred for  SNF Placement   Other Referral:   Interview type:  Family Other interview type:    PSYCHOSOCIAL DATA Living Status:  FACILITY Admitted from facility:  [facility] Level of care:  Skilled Nursing Facility Primary support name:  Victorino SparrowMike Garner Primary support relationship to patient:  SIBLING Degree of support available:   Pt has strong support from siblings and other family    CURRENT CONCERNS Current Concerns  Post-Acute Placement   Other Concerns:    SOCIAL WORK ASSESSMENT / PLAN CSW notified pt was admitted from faciiity. CSW spoke with pt brothers and sister-in-law at bedside. They informed CSW that pt has been at facility for quite a few years now and is a long term resident. Pt brother Kathlene NovemberMike and sister-in-law Pam concerned about pt bed not being held. CSW informed family that there is a possibility that facility will need to release the bed to another pt if they are not paying for a bedhold, but per CSW knowledge facility is not concerned about this being an issue at this time. Pt family thankful for this information. They informed CSW that facility has been great with pt and they would definitely want pt to return at dc. CSW spoke with facility and confirmed no issues with pt returning at dc. CSW to keep family updated if there are any issues with pt not having a bed hold. CSW to keep facility updated on pt medical status.   Assessment/plan status:  Psychosocial Support/Ongoing Assessment of Needs Other assessment/ plan:   Information/referral to community resources:   None needed at this time    PATIENTS/FAMILYS RESPONSE TO PLAN OF CARE: Pt family very pleasant and  cooperative. They are agreeable to pt returning to SNF at dc.    Kellsie Grindle, LCSWA  [phone]

## 2014-09-14 NOTE — Care Management Note (Signed)
    Page 1 of 1   09/14/2014     9:22:34 AM CARE MANAGEMENT NOTE 09/14/2014  Patient:  Tony Terry,Tony   Account Number:  [id number]  Date Initiated:  09/14/2014  Documentation initiated by:  Junius CreamerWELL,DEBBIE  Subjective/Objective Assessment:   adm w pneumonia     Action/Plan:   pt from [facility], mental retardation, pcp dr Kerry Dorydasanayaka   Anticipated DC Date:  09/17/2014   Anticipated DC Plan:  GROUP HOME  In-house referral  Clinical Social Worker         Choice offered to / List presented to:             Status of service:   Medicare Important Message given?  YES (If response is "NO", the following Medicare IM given date fields will be blank) Date Medicare IM given:  09/14/2014 Medicare IM given by:  Junius CreamerWELL,DEBBIE Date Additional Medicare IM given:   Additional Medicare IM given by:    Discharge Disposition:    Per UR Regulation:  Reviewed for med. necessity/level of care/duration of stay  If discussed at Long Length of Stay Meetings, dates discussed:    Comments:

## 2014-09-14 NOTE — Consult Note (Signed)
Name: Tony CoriaMark Terry MRN: [id number] DOB: July 10, 1956    ADMISSION DATE:  09/11/2014 CONSULTATION DATE:  09/14/2014  REFERRING MD :  TRH  CHIEF COMPLAINT:  AMS and hypercarbic respiratory failure.  BRIEF PATIENT DESCRIPTION: 58 year old male with PMH of MR, mental capacity of a 58 year old, who is usually pretty active in the SNF.  In the SNF he was noted to have AMS and SOB and [facility].  Patient as admitted to SDU where blood gases showed chronic hypercarbic respiratory failure.  Patient was never profoundly acidotic however, pH was never less than 7.3.    SIGNIFICANT EVENTS  12/4 [facility]. 12/7 PCCM to see for AMS and hypercarbic respiratory failure.  STUDIES:  MRI of the brain negative. CT of the brain negative.  PAST MEDICAL HISTORY :   has a past medical history of Unspecified intellectual disabilities; Excitative type psychosis; CHF (congestive heart failure); Chronic respiratory failure; Atrial fibrillation; Obstructive sleep apnea (adult) (pediatric); Diabetes mellitus without complication; Edema; Hyperlipidemia; Hypertension; Hypopotassemia; Seizures; GERD (gastroesophageal reflux disease); Unspecified constipation; Obesity; and Femoral neck fracture.  has no past surgical history on file. Prior to Admission medications   Medication Sig Start Date End Date Taking? Authorizing Provider  aspirin 81 MG chewable tablet Take 1 tablet by mouth daily for A-Fib  * use house stock*   Yes Historical Provider, MD  carbamazepine (TEGRETOL) 200 MG tablet Take 300 mg by mouth 3 (three) times daily. 1 and 1/2 tablets =300mg    Yes Historical Provider, MD  diltiazem (CARDIZEM) 120 MG tablet Take 1 capsule by mouth daily for HTN   Yes Historical Provider, MD  insulin aspart (NOVOLOG) 100 UNIT/ML injection Inject into the skin. Check BS before meals if 100-150=5 units, if 151 and greater = 10 units  * No Bedtime Dose* for DM *Do Not mix with  other insulins*  Expires 28 days after opening.  *check expiration date*   Yes Historical Provider, MD  insulin glargine (LANTUS) 100 UNIT/ML injection Inject 70 units subcutaneously every night at bedtime for DM  *Do Not Mix with other Insulins* expires 28 days after opening.  *check expiration date*   Yes Historical Provider, MD  metoprolol tartrate (LOPRESSOR) 25 MG tablet Take 75 mg by mouth 2 (two) times daily. **Hold for pulse </=50 and SBP</= 100**   Yes Historical Provider, MD  pioglitazone (ACTOS) 30 MG tablet Take 30 mg by mouth daily.    Yes Historical Provider, MD  primidone (MYSOLINE) 50 MG tablet Take 200 mg by mouth 3 (three) times daily.    Yes Historical Provider, MD  ranitidine (ZANTAC) 75 MG tablet Take 75 mg by mouth at bedtime. For GERD   Yes Historical Provider, MD  sennosides-docusate sodium (SENOKOT-S) 8.6-50 MG tablet Take 1 tabley by mouth every night at bedtime for constipation  * Use House Stock*   Yes Historical Provider, MD  traMADol (ULTRAM) 50 MG tablet Take 1 tablet by mouth every 4 hours as needed for pain 04/22/14  Yes Kimber RelicArthur G Green, MD  Vitamin D, Ergocalciferol, (DRISDOL) 50000 UNITS CAPS capsule Take 50,000 Units by mouth every 7 (seven) days. MONDAY   Yes Historical Provider, MD   No Known Allergies  FAMILY HISTORY:  family history includes Cancer in his father and mother. SOCIAL HISTORY:  reports that he has been passively smoking.  He does not have any smokeless tobacco history on file. He reports that he does not  drink alcohol or use illicit drugs.  REVIEW OF SYSTEMS:   Patient with severe MR, unable to obtain much history.  SUBJECTIVE:   VITAL SIGNS: Temp:  [97.6 F (36.4 C)-98.8 F (37.1 C)] 98.6 F (37 C) (12/07 1132) Pulse Rate:  [67-92] 75 (12/07 1200) Resp:  [12-30] 22 (12/07 1200) BP: (110-166)/(57-99) 149/73 mmHg (12/07 1200) SpO2:  [87 %-98 %] 98 % (12/07 1200) FiO2 (%):  [40 %-50 %] 40 % (12/07 1200) Weight:  [112.2 kg (247 lb 5.7  oz)] 112.2 kg (247 lb 5.7 oz) (12/07 0500)  PHYSICAL EXAMINATION: General:  Chronically ill appearing obese male, lethargic but arousable and follows some commands. Neuro:  Awake, MR as above, moves all ext to command. HEENT:  [place]/AT, PERRL, EOM-I and MMM. Cardiovascular:  RRR, Nl S1/S2, -M/R/G. Lungs:  CTA bilaterally. Abdomen:  Soft, NT, ND and +BS. Musculoskeletal:  -edema and -tenderness. Skin:  Intact.  Recent Labs Lab 09/12/14 0430 09/13/14 0645 09/14/14 0233  NA 137 138 139  K 4.3 4.6 4.6  CL 93* 95* 95*  CO2 35* 35* 36*  BUN 14 13 15   CREATININE 0.44* 0.39* 0.45*  GLUCOSE 108* 118* 93    Recent Labs Lab 09/12/14 0430 09/13/14 0645 09/14/14 0233  HGB 15.2 15.0 14.5  HCT 47.8 47.7 45.6  WBC 6.8 5.2 4.9  PLT 113* 113* 99*   Mr Brain Wo Contrast  09/13/2014   CLINICAL DATA:  New onset Altered level of consciousness for this 58 year old male with mental retardation, atrial fibrillation, diabetes, and seizures. Hypercapnic respiratory failure. Initial encounter.  EXAM: MRI HEAD WITHOUT CONTRAST  TECHNIQUE: Multiplanar, multiecho pulse sequences of the brain and surrounding structures were obtained without intravenous contrast.  COMPARISON:  CT head 09/12/2014.  FINDINGS: The patient was unable to remain motionless for the exam. Small or subtle lesions could be overlooked.  No evidence for acute infarction, hemorrhage, mass lesion, hydrocephalus, or extra-axial fluid. Mild atrophy. Minor white matter signal abnormality, nonspecific. Flow voids are maintained throughout the carotid, basilar, and vertebral arteries. No midline abnormality. Small microbleed LEFT posterior temporal subcortical white matter likely chronic, of doubtful significance. Flow voids are maintained. Extracranial soft tissues grossly unremarkable.  IMPRESSION: Motion degraded exam.  No acute intracranial abnormality.  Mild atrophy.   Electronically Signed   By: Davonna BellingJohn  Curnes M.D.   On: 09/13/2014 13:16   Dg  Chest Port 1 View  09/13/2014   CLINICAL DATA:  Hypoxia  EXAM: PORTABLE CHEST - 1 VIEW  COMPARISON:  09/11/2014  FINDINGS: There is patchy lingular and left lower lobe airspace disease. There is no pleural effusion or pneumothorax. Right lung is clear. There is stable cardiomegaly there is prominence of the pulmonary vasculature. There is no acute osseous abnormality.  IMPRESSION: Patchy lingular and left lower lobe airspace disease which may reflect multifocal atelectasis versus pneumonia.   Electronically Signed   By: Elige KoHetal  Patel   On: 09/13/2014 17:42    ASSESSMENT / PLAN:  58 year old male with history of MR who presents with AMS and SOB.  The ABGs are consistent more with chronic rather than acute respiratory failure.  Patient is morbidly obese and has history of OSA that he refuses to wear CPAP for.  MRI and CT of the brain are both negative for a stroke.  The medications supposedly have not changed from home medications.  He is, and has been, on barbital which can affect mental status but again not new and dose has not increased.  CBGs have been stable.  Another possibility is that sepsis is the main cause of his AMS as one would see in the elderly when infected.  At this point patient is stable.  I would not recommend going down the hospice route unless completely unresponsive and agonal.  Another possibility is delirium due to change in environment.  I spoke with family extensively, supported the DNR status.  May want to consider an EEG and a neurology consult to see if reduction in barbital would be a consideration here.  Will also order an ammonia level and re-evaluate when labs result.  Would change BiPAP to CPAP QHS as well as I highly doubt that this is hyeprcarbia driven.  PCCM will follow with you.  Alyson Reedy, M.D. [facility] Pulmonary/Critical Care Medicine. Pager: [phone]. After hours pager: [phone].  09/14/2014, 3:18 PM

## 2014-09-14 NOTE — Evaluation (Signed)
Physical Therapy Evaluation Patient Details Name: Tony CoriaMark Terry MRN: [id number] DOB: 1956/04/01 Today's Date: 09/14/2014   History of Present Illness  58 y.o. male with a past medical history of mental retardation, congestive heart failure of unknown type, chronic respiratory failure, atrial fibrillation, obstructive sleep apnea, diabetes mellitus type 2, seizure disorder who lives in a skilled nursing facility and was brought in due to decreased level of consciousness and worsening shortness of breath. He was noted to have hypercapnic respiratory failure. Pt found to have PNA and placed on bipap.  Clinical Impression  Pt admitted with above. Pt currently with functional limitations due to the deficits listed below (see PT Problem List).  Pt will benefit from skilled PT to increase their independence and safety with mobility to allow discharge to the venue listed below. Pt currently far below his normal level of function.      Follow Up Recommendations SNF    Equipment Recommendations  None recommended by PT    Recommendations for Other Services       Precautions / Restrictions Precautions Precautions: Fall      Mobility  Bed Mobility Overal bed mobility: Needs Assistance Bed Mobility: Sit to Sidelying;Rolling;Sidelying to Sit Rolling: +2 for physical assistance;Total assist Sidelying to sit: +2 for physical assistance;Total assist     Sit to sidelying: +2 for physical assistance;Total assist General bed mobility comments: Assist to bring legs off of bed and bring trunk up into sitting. Pt assisting to roll by grasping rail with RUE but didn't release rail as coming into sitting making it more difficult.  Transfers                    Ambulation/Gait                Stairs            Wheelchair Mobility    Modified Rankin (Stroke Patients Only)       Balance Overall balance assessment: Needs assistance Sitting-balance support: Bilateral upper  extremity supported;Feet unsupported Sitting balance-Leahy Scale: Poor Sitting balance - Comments: Pt sat EOB x 10 minutes with supervision to min A.                                     Pertinent Vitals/Pain Pain Assessment: Faces Faces Pain Scale: No hurt    Home Living Family/patient expects to be discharged to:: Skilled nursing facility                      Prior Function Level of Independence: Needs assistance   Gait / Transfers Assistance Needed: Pt was modified independent with w/c mobility [facility]     Comments: Pt likes bowling and likes to play bingo.     Hand Dominance        Extremity/Trunk Assessment   Upper Extremity Assessment: Defer to OT evaluation           Lower Extremity Assessment: Generalized weakness;Difficult to assess due to impaired cognition         Communication      Cognition Arousal/Alertness: Lethargic   Overall Cognitive Status: Impaired/Different from baseline Area of Impairment: Following commands;Problem solving       Following Commands: Follows one step commands inconsistently     Problem Solving: Slow processing;Requires verbal cues;Requires tactile cues General Comments: At baseline pt able to converse and make needs known.  General Comments      Exercises        Assessment/Plan    PT Assessment Patient needs continued PT services  PT Diagnosis Difficulty walking;Generalized weakness;Altered mental status   PT Problem List Decreased strength;Decreased activity tolerance;Decreased balance;Decreased mobility;Decreased cognition;Decreased knowledge of use of DME  PT Treatment Interventions Stair training;Functional mobility training;Therapeutic activities;Therapeutic exercise;Wheelchair mobility training;Patient/family education;Balance training   PT Goals (Current goals can be found in the Care Plan section) Acute Rehab PT Goals Patient Stated Goal: not stated PT Goal  Formulation: Patient unable to participate in goal setting Time For Goal Achievement: 09/28/14 Potential to Achieve Goals: Fair    Frequency Min 2X/week   Barriers to discharge        Co-evaluation PT/OT/SLP Co-Evaluation/Treatment: Yes Reason for Co-Treatment: Complexity of the patient's impairments (multi-system involvement);For patient/therapist safety PT goals addressed during session: Mobility/safety with mobility;Balance         End of Session   Activity Tolerance: Patient limited by lethargy Patient left: in bed;with call bell/phone within reach;with family/visitor present;with nursing/sitter in room           Time: 1445-1511 PT Time Calculation (min) (ACUTE ONLY): 26 min   Charges:   PT Evaluation $Initial PT Evaluation Tier I: 1 Procedure PT Treatments $Therapeutic Activity: 8-22 mins   PT G Codes:          Vane Yapp 09/14/2014, 3:41 PM  [facility]Dashawna Delbridge PT [phone]

## 2014-09-15 LAB — CBC
HCT: 44.1 % (ref 39.0–52.0)
HEMOGLOBIN: 14.1 g/dL (ref 13.0–17.0)
MCH: 31.7 pg (ref 26.0–34.0)
MCHC: 32 g/dL (ref 30.0–36.0)
MCV: 99.1 fL (ref 78.0–100.0)
PLATELETS: 102 10*3/uL — AB (ref 150–400)
RBC: 4.45 MIL/uL (ref 4.22–5.81)
RDW: 13.4 % (ref 11.5–15.5)
WBC: 4.4 10*3/uL (ref 4.0–10.5)

## 2014-09-15 LAB — BASIC METABOLIC PANEL
ANION GAP: 8 (ref 5–15)
BUN: 14 mg/dL (ref 6–23)
CHLORIDE: 96 meq/L (ref 96–112)
CO2: 34 meq/L — AB (ref 19–32)
Calcium: 8.7 mg/dL (ref 8.4–10.5)
Creatinine, Ser: 0.41 mg/dL — ABNORMAL LOW (ref 0.50–1.35)
GFR calc Af Amer: >90 mL/min (ref >90)
GFR calc non Af Amer: >90 mL/min (ref >90)
GLUCOSE: 92 mg/dL (ref 70–99)
POTASSIUM: 4.4 meq/L (ref 3.7–5.3)
SODIUM: 138 meq/L (ref 137–147)

## 2014-09-15 LAB — HEPATIC FUNCTION PANEL
ALBUMIN: 3 g/dL — AB (ref 3.5–5.2)
ALT: 45 U/L (ref 0–53)
AST: 66 U/L — ABNORMAL HIGH (ref 0–37)
Alkaline Phosphatase: 63 U/L (ref 39–117)
Bilirubin, Direct: <0.2 mg/dL (ref 0.0–0.3)
TOTAL PROTEIN: 7.5 g/dL (ref 6.0–8.3)
Total Bilirubin: 0.6 mg/dL (ref 0.3–1.2)

## 2014-09-15 LAB — GLUCOSE, CAPILLARY
GLUCOSE-CAPILLARY: 107 mg/dL — AB (ref 70–99)
GLUCOSE-CAPILLARY: 163 mg/dL — AB (ref 70–99)
GLUCOSE-CAPILLARY: 137 mg/dL — AB (ref 70–99)
GLUCOSE-CAPILLARY: 163 mg/dL — AB (ref 70–99)

## 2014-09-15 LAB — PHENOBARBITAL LEVEL: PHENOBARBITAL: 10.4 ug/mL — AB (ref 15.0–40.0)

## 2014-09-15 MED ORDER — LACTULOSE 10 GM/15ML PO SOLN
20.0000 g | Freq: Two times a day (BID) | ORAL | Status: DC
  Administered 2014-09-15 – 2014-09-16 (×3): 20 g via ORAL
  Filled 2014-09-15 (×4): qty 30

## 2014-09-15 NOTE — Plan of Care (Signed)
Problem: SLP Dysphagia Goals Goal: Patient will utilize recommended strategies Patient will utilize recommended strategies during swallow to increase swallowing safety with  Outcome: Completed/Met Date Met:  09/15/14

## 2014-09-15 NOTE — Progress Notes (Signed)
[facility] PROGRESS NOTE  Tony Terry RUE:[id number] DOB: 12/07/1955 DOA: 09/11/2014  PCP: Angela Cox, MD  Brief HPI: Tony Terry is a 58 y.o. male with a past medical history of mental retardation, congestive heart failure of unknown type, chronic respiratory failure, atrial fibrillation, obstructive sleep apnea, diabetes mellitus type 2, seizure disorder who lives in a skilled nursing facility and was brought in due to decreased level of consciousness and worsening shortness of breath. He was noted to have hypercapnic respiratory failure. He apparently is noncompliant with his CPAP. He was also noted to have multilobar pneumonia. He was subsequently admitted to the hospital. He was initially transferred out of the stepdown unit after improvement with BiPAP. On the floor he again became lethargic. He had to be transferred back to the stepdown unit. He appears to become drowsy and lethargic whenever he is taken off of the BiPAP of the sleep. Pulmonology was consulted.  Past medical history:  Past Medical History  Diagnosis Date  . Unspecified intellectual disabilities   . Excitative type psychosis   . CHF (congestive heart failure)   . Chronic respiratory failure   . Atrial fibrillation   . Obstructive sleep apnea (adult) (pediatric)   . Diabetes mellitus without complication   . Edema   . Hyperlipidemia   . Hypertension   . Hypopotassemia   . Seizures   . GERD (gastroesophageal reflux disease)   . Unspecified constipation   . Obesity   . Femoral neck fracture     Consultants: None  Procedures:  2D ECHO Study Conclusions - Left ventricle: The cavity size was normal. Wall thickness wasincreased in a pattern of mild LVH. Systolic function was normal.The estimated ejection fraction was in the range of 60% to 65%.The study is not technically sufficient to allow evaluation of LVdiastolic function. - Left atrium: Severely dilated 50 ml/m2. - Right atrium:  Moderately dilated. Impressions: - Grossly normal LV function, unable to estimate diastolicfunction, moderate to severe biatrial enlargment.  EEG Pending  Antibiotics: Vanc and Cefepime 12/4-->12/7 Levaquin 12/7-->  Subjective: Patient remains on CPAP. Alert. However it is still difficult to understand him.   Objective: Vital Signs  Filed Vitals:   09/15/14 0200 09/15/14 0300 09/15/14 0400 09/15/14 0708  BP: 140/67 140/67 126/95 157/77  Pulse: 61 63 66 70  Temp:  98.1 F (36.7 C)  99.2 F (37.3 C)  TempSrc:  Oral  Axillary  Resp: 23 22 24  67  Height:      Weight:  113 kg (249 lb 1.9 oz)    SpO2: 97% 97% 97% 97%    Intake/Output Summary (Last 24 hours) at 09/15/14 0714 Last data filed at 09/14/14 2300  Gross per 24 hour  Intake    610 ml  Output    350 ml  Net    260 ml   Filed Weights   09/12/14 1658 09/14/14 0500 09/15/14 0300  Weight: 115.8 kg (255 lb 4.7 oz) 112.2 kg (247 lb 5.7 oz) 113 kg (249 lb 1.9 oz)    General appearance: alert, wearing BiPAP, distracted, no distress and moderately obese Resp: clear to auscultation bilaterally Cardio: regular rate and rhythm, S1, S2 normal, no murmur, click, rub or gallop GI: soft, non-tender; bowel sounds normal; no masses,  no organomegaly Extremities: extremities normal, atraumatic, no cyanosis or edema Neurologic: No obvious focal neurological deficits. He doesn't move his lower extremities as much. Doesn't follow commands, which makes it very difficult to assess.   Lab Results:  Basic Metabolic Panel:  Recent Labs Lab 09/11/14 1419 09/12/14 0430 09/13/14 0645 09/14/14 0233 09/15/14 0301  NA 140 137 138 139 138  K 4.4 4.3 4.6 4.6 4.4  CL 91* 93* 95* 95* 96  CO2 43* 35* 35* 36* 34*  GLUCOSE 117* 108* 118* 93 92  BUN 12 14 13 15 14   CREATININE 0.50 0.44* 0.39* 0.45* 0.41*  CALCIUM 9.2 9.0 8.8 8.4 8.7   Liver Function Tests:  Recent Labs Lab 09/11/14 1419 09/12/14 0430  AST 26 26  ALT 19 18    ALKPHOS 80 71  BILITOT 0.4 0.6  PROT 8.7* 7.9  ALBUMIN 3.5 3.1*   CBC:  Recent Labs Lab 09/11/14 1419 09/12/14 0430 09/13/14 0645 09/14/14 0233 09/15/14 0301  WBC 8.6 6.8 5.2 4.9 4.4  HGB 15.6 15.2 15.0 14.5 14.1  HCT 48.8 47.8 47.7 45.6 44.1  MCV 101.7* 102.4* 101.1* 100.2* 99.1  PLT 120* 113* 113* 99* 102*   BNP (last 3 results)  Recent Labs  09/11/14 1418  PROBNP 64.9   CBG:  Recent Labs Lab 09/13/14 0846 09/13/14 1634 09/13/14 2137 09/14/14 0805 09/14/14 1707  GLUCAP 114* 96 90 96 87    Recent Results (from the past 240 hour(s))  Urine culture     Status: None   Collection Time: 09/11/14  3:11 PM  Result Value Ref Range Status   Specimen Description URINE, CATHETERIZED  Final   Special Requests ADDED [id number]  Final   Culture  Setup Time   Final    09/12/2014 01:32 Performed at [facility]    Colony Count NO GROWTH Performed at [facility]   Final   Culture NO GROWTH Performed at [facility]   Final   Report Status 09/13/2014 FINAL  Final  Blood Culture (routine x 2)     Status: None (Preliminary result)   Collection Time: 09/11/14  4:45 PM  Result Value Ref Range Status   Specimen Description BLOOD ARM LEFT  Final   Special Requests BOTTLES DRAWN AEROBIC AND ANAEROBIC 5CC  Final   Culture  Setup Time   Final    09/11/2014 22:25 Performed at [facility]    Culture   Final           BLOOD CULTURE RECEIVED NO GROWTH TO DATE CULTURE WILL BE HELD FOR 5 DAYS BEFORE ISSUING A FINAL NEGATIVE REPORT Performed at [facility]    Report Status PENDING  Incomplete  Blood Culture (routine x 2)     Status: None (Preliminary result)   Collection Time: 09/11/14  4:48 PM  Result Value Ref Range Status   Specimen Description BLOOD ARM RIGHT  Final   Special Requests BOTTLES DRAWN AEROBIC AND ANAEROBIC 5CC  Final   Culture  Setup Time   Final    09/11/2014 22:27 Performed at [facility]     Culture   Final           BLOOD CULTURE RECEIVED NO GROWTH TO DATE CULTURE WILL BE HELD FOR 5 DAYS BEFORE ISSUING A FINAL NEGATIVE REPORT Performed at [facility]    Report Status PENDING  Incomplete  MRSA PCR Screening     Status: None   Collection Time: 09/11/14  5:47 PM  Result Value Ref Range Status   MRSA by PCR NEGATIVE NEGATIVE Final    Comment:        The GeneXpert MRSA Assay (FDA approved for NASAL specimens only), is one  component of a comprehensive MRSA colonization surveillance program. It is not intended to diagnose MRSA infection nor to guide or monitor treatment for MRSA infections.       Studies/Results: Mr Sherrin DaisyBrain Wo Contrast  09/13/2014   CLINICAL DATA:  New onset Altered level of consciousness for this 58 year old male with mental retardation, atrial fibrillation, diabetes, and seizures. Hypercapnic respiratory failure. Initial encounter.  EXAM: MRI HEAD WITHOUT CONTRAST  TECHNIQUE: Multiplanar, multiecho pulse sequences of the brain and surrounding structures were obtained without intravenous contrast.  COMPARISON:  CT head 09/12/2014.  FINDINGS: The patient was unable to remain motionless for the exam. Small or subtle lesions could be overlooked.  No evidence for acute infarction, hemorrhage, mass lesion, hydrocephalus, or extra-axial fluid. Mild atrophy. Minor white matter signal abnormality, nonspecific. Flow voids are maintained throughout the carotid, basilar, and vertebral arteries. No midline abnormality. Small microbleed LEFT posterior temporal subcortical white matter likely chronic, of doubtful significance. Flow voids are maintained. Extracranial soft tissues grossly unremarkable.  IMPRESSION: Motion degraded exam.  No acute intracranial abnormality.  Mild atrophy.   Electronically Signed   By: Davonna BellingJohn  Curnes M.D.   On: 09/13/2014 13:16   Dg Chest Port 1 View  09/13/2014   CLINICAL DATA:  Hypoxia  EXAM: PORTABLE CHEST - 1 VIEW  COMPARISON:  09/11/2014   FINDINGS: There is patchy lingular and left lower lobe airspace disease. There is no pleural effusion or pneumothorax. Right lung is clear. There is stable cardiomegaly there is prominence of the pulmonary vasculature. There is no acute osseous abnormality.  IMPRESSION: Patchy lingular and left lower lobe airspace disease which may reflect multifocal atelectasis versus pneumonia.   Electronically Signed   By: Elige KoHetal  Patel   On: 09/13/2014 17:42    Medications:  Scheduled: . antiseptic oral rinse  7 mL Mouth Rinse q12n4p  . aspirin  81 mg Oral Daily  . carbamazepine  300 mg Oral TID  . chlorhexidine  15 mL Mouth Rinse BID  . diltiazem  120 mg Oral Daily  . insulin aspart  0-15 Units Subcutaneous TID WC  . insulin aspart  0-5 Units Subcutaneous QHS  . lactulose  20 g Oral BID  . levofloxacin (LEVAQUIN) IV  500 mg Intravenous Q24H  . metoprolol tartrate  75 mg Oral BID  . primidone  200 mg Oral TID  . sodium chloride  3 mL Intravenous Q12H   Continuous: . sodium chloride 10 mL/hr at 09/14/14 1000   ZOX:[id number]:acetaminophen **OR** acetaminophen, levalbuterol, ondansetron **OR** ondansetron (ZOFRAN) IV, RESOURCE THICKENUP CLEAR  Assessment/Plan:  Principal Problem:   HCAP (healthcare-associated pneumonia) Active Problems:   Atrial fibrillation   Congestive heart failure   Intellectual disability   Obstructive sleep apnea   Acute respiratory failure with hypercapnia   Chronic respiratory failure   DM type 2 (diabetes mellitus, type 2)   Seizure disorder   Acute encephalopathy    Acute respiratory failure with hypercapnia Remains dependent on CPAP/BiPAP. Appreciate pulmonology input. Continue to monitor him for now. He also has chronic respiratory failure with chronic retention of CO2.   Healthcare Associated Pneumonia Blood cultures are negative. Antibiotics were changed to Levaquin. There was a concern raised about mild interstitial edema on the initial x-ray, but not in the  subsequent one. Echocardiogram is as above. Systolic function is normal. Swallow evaluation was done. Continue Dys 3 diet.  Acute encephalopathy His mental status improves whenever he is adequately ventilated. But whenever he is off of the BiPAP or CPAP he  tends to become groggy. CT head was unremarkable. Due to concerns about left-sided facial droop yesterday, MRI was obtained which did not show any acute events. I think his presentation is mostly due to his respiratory failure. Sepsis could be contributing. Elevated ammonia level was noted yesterday. He does not have any liver dysfunction. He could have underlying hereditary Metabolic reasons for his elevated ammonia. It remains unclear of the elevated ammonia by itself is causing him to have these alteration in mental status. We will put him on lactulose twice a day and repeat ammonia level in the morning. Also discussed with the neurologist, Dr. Roseanne RenoStewart as recommended by pulmonology. EEG is pending. Dr. Roseanne RenoStewart recommends getting a Tegretol level and primidone level along with phenobarbital level. They will be available for consultation if necessary based on the results of these tests.  History of congestive heart failure, unspecified type Appears reasonably well compensated. Echocardiogram is as above. He is not on diuretics at home. X-ray was more suggestive of pneumonia rather than interstitial edema. We are holding off diuretics for now. He was given 1 dose of Lasix earlier in this admission.   History of chronic atrial fibrillation Continue with his rate control medications. He is only on aspirin and not on anticoagulation.  History of obstructive sleep apnea Apparently he is noncompliant with his CPAP at the skilled nursing facility.   History of diabetes mellitus type 2 on insulin Noted to have low blood sugars. We will stop his Lantus. This is likely due to poor oral intake. Continue just sliding scale coverage. HbA1c is 6.0.  History of  seizure disorder Continue with Tegretol and primidone. No reports of seizure activity recently. Check levels as mentioned above. EEG is pending.  History of mental retardation/intellectual disability This is since birth. He has limited mobility and gets around in a wheelchair. However, is able to communicate reasonably well with family at baseline.  Thrombocytopenia. We will stop his heparin. Monitor counts closely. Counts stable so far. No bleeding.  DVT Prophylaxis: SCDs Code Status: DO NOT RESUSCITATE Family Communication: Long discussion with family yesterday.  Disposition Plan: OOB to chair as tolerated. Likely will return to skilled nursing facility when he improves.   LOS: 4 days   [facility] Vista HospitalKRISHNAN,Kendrell Lottman  [facility] Pager [phone] 09/15/2014, 7:14 AM  If 8PM-8AM, please contact night-coverage at [email or web address], password [email or web address]

## 2014-09-15 NOTE — Progress Notes (Signed)
Ammonia level called to elink.

## 2014-09-15 NOTE — Procedures (Signed)
EEG report.  Brief clinical history:  Tony Terry is a 58 y.o. male with a past medical history of mental retardation, congestive heart failure of unknown type, chronic respiratory failure, atrial fibrillation, obstructive sleep apnea, diabetes mellitus type 2, seizure disorder who lives in a skilled nursing facility and was brought in due to decreased level of consciousness and worsening shortness of breath. Patient is on carbamazepine.  Technique: this is a 17 channel routine scalp EEG performed at the bedside with bipolar and monopolar montages arranged in accordance to the international 10/20 system of electrode placement. One channel was dedicated to EKG recording.  No sleep was recorded. No activating procedures performed.  Description: the overall study shows disorganized slow generalized activity with a best background characterized by a medium amplitude, symmetric 6 Hz rhythm. No sleep was obtained. Very frequent, intermittent burst of generalized spikes with a frequency of 1 to Hz per second that at times occur independently and have higher amplitude over the left hemisphere were noted.  No electrographic seizures noted. EKG showed sinus rhythm.  Impression: this is an abnormal awake EEG because of the above stated findings. Although the presence of generalized epileptiform discharges is consistent with a primary generalized epilepsy, the fact that these discharges have a higher amplitude and often are more localized over the left hemisphere raises the possibility of a focal epilepsy arising from the left hemisphere.  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 No electrographic seizures noted. Clinical correlation is advised.   Wyatt Portelasvaldo Camilo, MD

## 2014-09-15 NOTE — Progress Notes (Signed)
Speech Language Pathology Treatment: Dysphagia  Patient Details Name: Tony CoriaMark Terry MRN: [id number] DOB: 1956/09/06 Today's Date: 09/15/2014 Time: [id number] SLP Time Calculation (min) (ACUTE ONLY): 25 min  Assessment / Plan / Recommendation Clinical Impression  Pt seems to be tolerating dysphagia 3 (mechanical soft) diet and thin liquids. He showed no signs aspiration across 4oz. of liquid trials provided by SLP. However, due to his cognitive status (intellectual impairment) and impulsivity, it is necessary for him to be fed by a trained caregiver. To control sip size and rate, recommend that all liquids be consumed by straw with caregiver holding cup and straw so that it can be removed from the oral cavity between sips. SLP provided education regarding this recommendation to family (brother, and sister in law) and RN during the session. Continue dysphagia 3 (mechanical soft) diet with thin liquids. No further speech treatment is needed, SLP signing off.   HPI HPI: Pt is a 58 y.o. male with a past medical history significant for mental retardation, congestive heart failure of unknown type, chronic respiratory failure, atrial fibrillation, obstructive sleep apnea, diabetes mellitus type 2, seizure disorder who lives in a skilled nursing facility and was brought in on 12/4 due to decreased level of consciousness and worsening shortness of breath. Apparently, his carbon dioxide level was high according to EMS reports on 12/4.Pt with diagnosis of Pna on 12/1 and now exhibits worsening dyspnea.    Pertinent Vitals Pain Assessment: No/denies pain  SLP Plan  Discharge SLP treatment due to (comment) (no further treatment needed)    Recommendations Diet recommendations: Dysphagia 3 (mechanical soft);Thin liquid Liquids provided via: Straw Medication Administration: Whole meds with puree Supervision: Trained caregiver to feed patient Compensations: Slow rate;Small sips/bites (cargiver to hold cup and straw,  remove straw after each sip) Postural Changes and/or Swallow Maneuvers: Seated upright 90 degrees              Oral Care Recommendations: Oral care BID Follow up Recommendations: Skilled Nursing facility;Other (comment) Plan: Discharge SLP treatment due to (comment) (no further treatment needed)    GO     Barkley BrunsO'Brien, Katherine 09/15/2014, 2:23 PM

## 2014-09-15 NOTE — Progress Notes (Signed)
Pt is much improved and per family is back to his baseline. No new recommendations PCCM will sign off. Please call if we can be of further assistance  Tony Fischeravid Koran Seabrook, MD ; [facility] Mobile [phone].  After 5:30 PM or weekends, call [phone]

## 2014-09-16 LAB — CBC
HCT: 44.1 % (ref 39.0–52.0)
Hemoglobin: 14.2 g/dL (ref 13.0–17.0)
MCH: 31.6 pg (ref 26.0–34.0)
MCHC: 32.2 g/dL (ref 30.0–36.0)
MCV: 98.2 fL (ref 78.0–100.0)
PLATELETS: 116 10*3/uL — AB (ref 150–400)
RBC: 4.49 MIL/uL (ref 4.22–5.81)
RDW: 13.2 % (ref 11.5–15.5)
WBC: 5.2 10*3/uL (ref 4.0–10.5)

## 2014-09-16 LAB — GLUCOSE, CAPILLARY
GLUCOSE-CAPILLARY: 98 mg/dL (ref 70–99)
Glucose-Capillary: 113 mg/dL — ABNORMAL HIGH (ref 70–99)
Glucose-Capillary: 115 mg/dL — ABNORMAL HIGH (ref 70–99)
Glucose-Capillary: 117 mg/dL — ABNORMAL HIGH (ref 70–99)

## 2014-09-16 LAB — COMPREHENSIVE METABOLIC PANEL
ALT: 53 U/L (ref 0–53)
AST: 66 U/L — AB (ref 0–37)
Albumin: 2.8 g/dL — ABNORMAL LOW (ref 3.5–5.2)
Alkaline Phosphatase: 70 U/L (ref 39–117)
Anion gap: 7 (ref 5–15)
BUN: 11 mg/dL (ref 6–23)
CALCIUM: 8.9 mg/dL (ref 8.4–10.5)
CO2: 30 mEq/L (ref 19–32)
CREATININE: 0.41 mg/dL — AB (ref 0.50–1.35)
Chloride: 95 mEq/L — ABNORMAL LOW (ref 96–112)
GFR calc Af Amer: >90 mL/min (ref >90)
GFR calc non Af Amer: >90 mL/min (ref >90)
Glucose, Bld: 132 mg/dL — ABNORMAL HIGH (ref 70–99)
Potassium: 4.8 mEq/L (ref 3.7–5.3)
Sodium: 132 mEq/L — ABNORMAL LOW (ref 137–147)
TOTAL PROTEIN: 7.6 g/dL (ref 6.0–8.3)
Total Bilirubin: 0.5 mg/dL (ref 0.3–1.2)

## 2014-09-16 LAB — AMMONIA: Ammonia: 120 umol/L — ABNORMAL HIGH (ref 11–60)

## 2014-09-16 MED ORDER — LACTULOSE 10 GM/15ML PO SOLN
30.0000 g | Freq: Two times a day (BID) | ORAL | Status: DC
  Administered 2014-09-16 – 2014-09-18 (×4): 30 g via ORAL
  Filled 2014-09-16 (×5): qty 45

## 2014-09-16 NOTE — Progress Notes (Signed)
[facility]  Carlene CoriaMark Michaelsen OZH:[id number] DOB: 05/04/1956 DOA: 09/11/2014 PCP: Angela Coxasanayaka, Gayani Y, MD   Brief narrative: 58 year old male patient with known development disability, congestive heart failure, chronic respiratory failure, atrial fibrillation, sleep apnea, diabetes and seizure disorder. He resides at a skilled nursing facility. He was brought to the hospital because of altered level of consciousness and increasing shortness of breath.  Evaluation in the ER revealed hypercapnic respiratory failure. He apparently has been noncompliant with CPAP therapy. His chest x-ray also revealed a multilobar pneumonia. He was admitted to the stepdown unit.   After treatment with BiPAP and improvement in respiratory symptoms he was transferred out of the stepdown unit to the floor. After transfer he became lethargic again and was placed back on BiPAP. A pattern has emerged where he becomes drowsy and lethargic whenever he is taken off of BiPAP at hour of sleep. Pulmonary Medicine has been consulted. They felt that the patient's mentation was not driven by hypercarbia and suspected other issues especially given his history of seizure disorder. An EEG was recommended.   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  An ammonia level was checked which was elevated in the absence of significantly abnormal transaminases; lactulose was started.  On 12/9 EEG results were discussed with Dr. Roseanne RenoStewart of Neurology when a formal consult was requested. He recommended continuing monitoring the patient and continuing current medications noting that given the absence of witnessed or visualized seizure activity that the current EEG findings are not unexpected.  HPI/Subjective: Patient remains lethargic but does  awaken. No specific complaints.  Assessment/Plan:  Acute on chronic respiratory failure with hypercapnia -Continues to require nocturnal CPAP/BiPAP  -Appreciate pulmonology input. Compensated PCO2 appears to be in the 60s noting pH normal at this level.  -Pulmonary not convinced that patient's mentation issues are related to his hypercarbia   Healthcare Associated Pneumonia -Blood cultures negative -Antibiotics narrowed to Levaquin as of 12/6  Acute encephalopathy -Patient has developed a pattern of improved mentation with use of BiPAP and when does not use at hour sleep becomes more groggy  -CT head was unremarkable. Due to concerns about left-sided facial droop MRI was obtained which did not show any acute events. -Carbamazepine level was normal at 7.6, phenobarbital level was low at 10.8; primidone level is pending  History of seizure disorder -Continue with Tegretol and primidone -Tegretol level therapeutic, primidone level pending, and phenobarbital level subtherapeutic noting patient no longer on this medication -No reports of tonic clonic seizure activity recently -EEG question focal left hemispheric seizure activity; I recontacted Dr. Roseanne RenoStewart with neurology and discussed EEG results with him. He felt that in the absence of witnessed seizure activity in a patient with known seizure disorder that these EEG findings are not unexpected.  Hyper ammoniemia -Ammonia level on 12/8 was 107 and today up to 120 -Review of up to date, offers a potential differential diagnosis of partial urea cycle enzyme deficiency: "The differential diagnosis in older children and adults who present with hyper ammoniemia includes hepatic encephalopathy in patients with advanced liver failure, valproic acid poisoning, severe dehydration, and gastrointestinal bacterial overgrowth. Liver function test abnormalities are seen in patients with hepatic encephalopathy and in most patients with valproic acid  poisoning. Ammonia elevations are mild in patients with dehydration and bacterial overgrowth. Levels 100 to  150 mol per liter or greater should prompt a workup for a UCD." -cont lactulose and follow ammonia levels   History of congestive heart failure, unspecified type -Currently appears compensated -Echocardiogram this admission with preserved LV function, mild LVH with severe dilatation of left atrium and moderate dilatation of the right atrium; unfortunately study was not technically sufficient to allow evaluation for LV diastolic dysfunction. -He was not on diuretics at home.  -X-ray was more suggestive of pneumonia rather than interstitial edema therefore we are holding off diuretics for now.   History of chronic atrial fibrillation -Continue with his rate control medications.  -He is only on aspirin  -CHADVASc is 3 -Has significant biatrial dilatation  History of obstructive sleep apnea -Apparently he is noncompliant with his CPAP at the skilled nursing facility. -We'll continue to encourage use here although given his developmental disability will be very difficult to assist him in understanding the importance of using this device   History of diabetes mellitus type 2 on insulin -In the past 48 hours has been noted with low blood sugars in the 90 range.  -Lantus was discontinued 12/8.  -Suspect poor oral intake  -Continue sliding scale coverage.  -HbA1c is 6.0.  History of intellectual disability -Apparent onset since birth.  -He has limited mobility and gets around in a wheelchair and was residing at skilled nursing facility prior to admission.  -He is able to communicate reasonably well with family at baseline. -note a clinical finding associated with UCD is developmental delay  Thrombocytopenia. -Heparin was discontinued 12 7. -Current platelet count subtly trending upward  DVT prophylaxis: SCDs Code Status: DO NOT RESUSCITATE Family Communication: No family at the  bedside Disposition Plan/Expected LOS: Stepdown unit   Consultants: Pulmonary medicine/Dr. Molli Knock Neurology/Dr. Roseanne Reno (verbal telephone consultation only)  Procedures: 2-D echocardiogram: - Left ventricle: The cavity size was normal. Wall thickness wasincreased in a pattern of mild LVH. Systolic function was normal.The estimated ejection fraction was in the range of 60% to 65%. The study is not technically sufficient to allow evaluation of LV diastolic function.- Left atrium: Severely dilated 50 ml/m2.- Right atrium: Moderately dilated.  EEG: No electrographic seizures noted but patient had very frequent, intermittent bursts of generalized spikes that at times occurred independently and had a higher amplitude over the left hemisphere; this was reported as concerning for possible left focal hemispheric seizures  Cultures: Urine culture no growth Cultures no growth Urso PCR negative  Antibiotics: Cefepime 12/4 > 12/6 Levaquin 12/4 > Vancomycin 12/4 > 12/6  Objective: Blood pressure 128/84, pulse 74, temperature 99.5 F (37.5 C), temperature source Oral, resp. rate 17, height 5\' 2"  (1.575 m), weight 249 lb 1.9 oz (113 kg), SpO2 97 %.  Intake/Output Summary (Last 24 hours) at 09/16/14 1410 Last data filed at 09/16/14 0734  Gross per 24 hour  Intake    680 ml  Output    775 ml  Net    -95 ml   Exam: Gen: No acute respiratory distress-remains lethargic but easily awakened but drifts back off to sleep; no obvious focal deficits and patient able to reposition himself in the bed easily Chest: Coarse to auscultation bilaterally without wheezes, rhonchi or crackles, room air Cardiac: Regular rate and rhythm, S1-S2, no rubs murmurs or gallops, no peripheral edema, no JVD Abdomen: Soft, obese, nontender nondistended without obvious hepatosplenomegaly, no ascites Extremities: Symmetrical in appearance without cyanosis, clubbing or effusion  Scheduled Meds:  Scheduled Meds: . antiseptic  oral rinse  7 mL Mouth  Rinse q12n4p  . aspirin  81 mg Oral Daily  . carbamazepine  300 mg Oral TID  . chlorhexidine  15 mL Mouth Rinse BID  . diltiazem  120 mg Oral Daily  . insulin aspart  0-15 Units Subcutaneous TID WC  . insulin aspart  0-5 Units Subcutaneous QHS  . lactulose  20 g Oral BID  . levofloxacin (LEVAQUIN) IV  500 mg Intravenous Q24H  . metoprolol tartrate  75 mg Oral BID  . primidone  200 mg Oral TID  . sodium chloride  3 mL Intravenous Q12H    Data Reviewed: Basic Metabolic Panel:  Recent Labs Lab 09/12/14 0430 09/13/14 0645 09/14/14 0233 09/15/14 0301 09/16/14 0205  NA 137 138 139 138 132*  K 4.3 4.6 4.6 4.4 4.8  CL 93* 95* 95* 96 95*  CO2 35* 35* 36* 34* 30  GLUCOSE 108* 118* 93 92 132*  BUN 14 13 15 14 11   CREATININE 0.44* 0.39* 0.45* 0.41* 0.41*  CALCIUM 9.0 8.8 8.4 8.7 8.9   Liver Function Tests:  Recent Labs Lab 09/11/14 1419 09/12/14 0430 09/15/14 0301 09/16/14 0205  AST 26 26 66* 66*  ALT 19 18 45 53  ALKPHOS 80 71 63 70  BILITOT 0.4 0.6 0.6 0.5  PROT 8.7* 7.9 7.5 7.6  ALBUMIN 3.5 3.1* 3.0* 2.8*    Recent Labs Lab 09/14/14 1953 09/16/14 0205  AMMONIA 107* 120*   CBC:  Recent Labs Lab 09/12/14 0430 09/13/14 0645 09/14/14 0233 09/15/14 0301 09/16/14 0205  WBC 6.8 5.2 4.9 4.4 5.2  HGB 15.2 15.0 14.5 14.1 14.2  HCT 47.8 47.7 45.6 44.1 44.1  MCV 102.4* 101.1* 100.2* 99.1 98.2  PLT 113* 113* 99* 102* 116*   BNP (last 3 results)  Recent Labs  09/11/14 1418  PROBNP 64.9   CBG:  Recent Labs Lab 09/15/14 1239 09/15/14 1650 09/15/14 2212 09/16/14 0728 09/16/14 1207  GLUCAP 163* 137* 163* 98 113*    Recent Results (from the past 240 hour(s))  Urine culture     Status: None   Collection Time: 09/11/14  3:11 PM  Result Value Ref Range Status   Specimen Description URINE, CATHETERIZED  Final   Special Requests ADDED [id number]  Final   Culture  Setup Time   Final    09/12/2014 01:32 Performed at [facility]    Colony Count NO GROWTH Performed at [facility]   Final   Culture NO GROWTH Performed at [facility]   Final   Report Status 09/13/2014 FINAL  Final  Blood Culture (routine x 2)     Status: None (Preliminary result)   Collection Time: 09/11/14  4:45 PM  Result Value Ref Range Status   Specimen Description BLOOD ARM LEFT  Final   Special Requests BOTTLES DRAWN AEROBIC AND ANAEROBIC 5CC  Final   Culture  Setup Time   Final    09/11/2014 22:25 Performed at [facility]    Culture   Final           BLOOD CULTURE RECEIVED NO GROWTH TO DATE CULTURE WILL BE HELD FOR 5 DAYS BEFORE ISSUING A FINAL NEGATIVE REPORT Performed at [facility]    Report Status PENDING  Incomplete  Blood Culture (routine x 2)     Status: None (Preliminary result)   Collection Time: 09/11/14  4:48 PM  Result Value Ref Range Status   Specimen Description BLOOD ARM RIGHT  Final   Special Requests BOTTLES  DRAWN AEROBIC AND ANAEROBIC 5CC  Final   Culture  Setup Time   Final    09/11/2014 22:27 [facility]formed at [facility]    Culture   Final           BLOOD CULTURE RECEIVED NO GROWTH TO DATE CULTURE WILL BE HELD FOR 5 DAYS BEFORE ISSUING A FINAL NEGATIVE REPORT [facility]formed at [facility]    Report Status PENDING  Incomplete  MRSA PCR Screening     Status: None   Collection Time: 09/11/14  5:47 PM  Result Value Ref Range Status   MRSA by PCR NEGATIVE NEGATIVE Final    Comment:        The GeneXpert MRSA Assay (FDA approved for NASAL specimens only), is one component of a comprehensive MRSA colonization surveillance program. It is not intended to diagnose MRSA infection nor to guide or monitor treatment for MRSA infections.      Studies:  Recent x-ray studies have been reviewed in detail by the Attending Physician  Time spent :  35 mins   Junious Silkllison Ellis, ANP [facility] Office  [phone] Pager [phone]  On-Call/Text  Page:      [email or web address]      password [email or web address]  If 7PM-7AM, please contact night-coverage [email or web address] Password [email or web address] 09/16/2014, 2:10 PM   LOS: 5 days   I have personally examined this patient and reviewed the entire database. I have reviewed the above note, made any necessary editorial changes, and agree with its content.  Lonia BloodJeffrey T. Kindra Bickham, MD [facility]

## 2014-09-16 NOTE — Progress Notes (Signed)
Lactulose given today. Patient had large hard BM. Rise PaganiniURRY, Rhythm Gubbels R, RN

## 2014-09-17 DIAGNOSIS — R7989 Other specified abnormal findings of blood chemistry: Secondary | ICD-10-CM

## 2014-09-17 DIAGNOSIS — J9622 Acute and chronic respiratory failure with hypercapnia: Secondary | ICD-10-CM

## 2014-09-17 DIAGNOSIS — R740 Nonspecific elevation of levels of transaminase and lactic acid dehydrogenase [LDH]: Secondary | ICD-10-CM

## 2014-09-17 DIAGNOSIS — R7401 Elevation of levels of liver transaminase levels: Secondary | ICD-10-CM | POA: Insufficient documentation

## 2014-09-17 DIAGNOSIS — D696 Thrombocytopenia, unspecified: Secondary | ICD-10-CM

## 2014-09-17 DIAGNOSIS — Z9989 Dependence on other enabling machines and devices: Secondary | ICD-10-CM

## 2014-09-17 DIAGNOSIS — I429 Cardiomyopathy, unspecified: Secondary | ICD-10-CM

## 2014-09-17 DIAGNOSIS — E119 Type 2 diabetes mellitus without complications: Secondary | ICD-10-CM | POA: Insufficient documentation

## 2014-09-17 DIAGNOSIS — I482 Chronic atrial fibrillation, unspecified: Secondary | ICD-10-CM | POA: Insufficient documentation

## 2014-09-17 DIAGNOSIS — F79 Unspecified intellectual disabilities: Secondary | ICD-10-CM

## 2014-09-17 DIAGNOSIS — G4733 Obstructive sleep apnea (adult) (pediatric): Secondary | ICD-10-CM | POA: Insufficient documentation

## 2014-09-17 LAB — GLUCOSE, CAPILLARY
GLUCOSE-CAPILLARY: 86 mg/dL (ref 70–99)
GLUCOSE-CAPILLARY: 99 mg/dL (ref 70–99)
Glucose-Capillary: 104 mg/dL — ABNORMAL HIGH (ref 70–99)
Glucose-Capillary: 137 mg/dL — ABNORMAL HIGH (ref 70–99)
Glucose-Capillary: 132 mg/dL — ABNORMAL HIGH (ref 70–99)
Glucose-Capillary: 163 mg/dL — ABNORMAL HIGH (ref 70–99)

## 2014-09-17 LAB — CBC
HCT: 46.6 % (ref 39.0–52.0)
Hemoglobin: 15 g/dL (ref 13.0–17.0)
MCH: 31.9 pg (ref 26.0–34.0)
MCHC: 32.2 g/dL (ref 30.0–36.0)
MCV: 99.1 fL (ref 78.0–100.0)
Platelets: 106 10*3/uL — ABNORMAL LOW (ref 150–400)
RBC: 4.7 MIL/uL (ref 4.22–5.81)
RDW: 13.3 % (ref 11.5–15.5)
WBC: 5.5 10*3/uL (ref 4.0–10.5)

## 2014-09-17 LAB — COMPREHENSIVE METABOLIC PANEL
ALT: 72 U/L — ABNORMAL HIGH (ref 0–53)
AST: 81 U/L — ABNORMAL HIGH (ref 0–37)
Albumin: 3 g/dL — ABNORMAL LOW (ref 3.5–5.2)
Alkaline Phosphatase: 72 U/L (ref 39–117)
Anion gap: 9 (ref 5–15)
BILIRUBIN TOTAL: 0.5 mg/dL (ref 0.3–1.2)
BUN: 10 mg/dL (ref 6–23)
CHLORIDE: 97 meq/L (ref 96–112)
CO2: 32 meq/L (ref 19–32)
CREATININE: 0.47 mg/dL — AB (ref 0.50–1.35)
Calcium: 8.9 mg/dL (ref 8.4–10.5)
GFR calc Af Amer: >90 mL/min (ref >90)
GLUCOSE: 112 mg/dL — AB (ref 70–99)
Potassium: 4.6 mEq/L (ref 3.7–5.3)
Sodium: 138 mEq/L (ref 137–147)
Total Protein: 8 g/dL (ref 6.0–8.3)

## 2014-09-17 LAB — PROTIME-INR
INR: 1.18 (ref 0.00–1.49)
PROTHROMBIN TIME: 15.1 s (ref 11.6–15.2)

## 2014-09-17 LAB — CULTURE, BLOOD (ROUTINE X 2)
CULTURE: NO GROWTH
Culture: NO GROWTH

## 2014-09-17 LAB — AMMONIA: Ammonia: 96 umol/L — ABNORMAL HIGH (ref 11–60)

## 2014-09-17 LAB — APTT: aPTT: 30 seconds (ref 24–37)

## 2014-09-17 NOTE — Progress Notes (Signed)
Occupational Therapy Treatment Patient Details Name: Tony CoriaMark Terry MRN: [id number] DOB: Dec 09, 1955 Today's Date: 09/17/2014    History of present illness 58 y.o. male with a past medical history of mental retardation, congestive heart failure of unknown type, chronic respiratory failure, atrial fibrillation, obstructive sleep apnea, diabetes mellitus type 2, seizure disorder who lives in a skilled nursing facility and was brought in due to decreased level of consciousness and worsening shortness of breath. He was noted to have hypercapnic respiratory failure. Pt found to have PNA and placed on bipap.   OT comments  This 58 yo male seen today with PT due to safety issues due to pt's size and cognitive issues. Focus on bed mobility, sitting balance, transfers, and grooming. Pt making progress with transfers this session. Pt will continue to benefit from OT services.  Follow Up Recommendations  SNF    Equipment Recommendations  None recommended by OT       Precautions / Restrictions Precautions Precautions: Fall Restrictions Weight Bearing Restrictions: No       Mobility Bed Mobility Overal bed mobility: Needs Assistance;+2 for physical assistance Bed Mobility: Supine to Sit Rolling: +2 for physical assistance   Supine to sit: Max assist;+2 for physical assistance        Transfers Overall transfer level: Needs assistance Equipment used: 2 person hand held assist Transfers: Sit to/from [employer]Stand;Stand Pivot Transfers Sit to Stand: Mod assist;+2 physical assistance Stand pivot transfers: Mod assist;+2 physical assistance       General transfer comment: Wide base of support    Balance Overall balance assessment: Needs assistance Sitting-balance support: No upper extremity supported;Feet supported Sitting balance-Leahy Scale: Fair     Standing balance support: Bilateral upper extremity supported Standing balance-Leahy Scale: Poor                     ADL Overall  ADL's : Needs assistance/impaired     Grooming: Wash/dry face;Wash/dry hands;Total assistance;Sitting (hand over hand)                   Toilet Transfer: +2 for physical assistance;Moderate assistance (Bil HHA; bed>recliner going to pt's left)                              Cognition   Behavior During Therapy:  (He smiled once we got him to the recliner, up until that point flat affect) Overall Cognitive Status: Impaired/Different from baseline Area of Impairment: Following commands;Safety/judgement;Problem solving   Current Attention Level: Sustained    Following Commands: Follows one step commands inconsistently;Follows one step commands with increased time     Problem Solving: Slow processing;Requires verbal cues;Requires tactile cues                   Pertinent Vitals/ Pain       Pain Assessment: Faces Faces Pain Scale: No hurt         Frequency Min 2X/week     Progress Toward Goals  OT Goals(current goals can now be found in the care plan section)  Progress towards OT goals: Progressing toward goals     Plan Discharge plan remains appropriate    Co-evaluation    PT/OT/SLP Co-Evaluation/Treatment: Yes Reason for Co-Treatment: For patient/therapist safety   OT goals addressed during session: ADL's and self-care         Activity Tolerance Patient tolerated treatment well   Patient Left in chair;with call bell/phone within reach;with chair  alarm set   Nurse Communication Mobility status (maxi sky and alarm pad under him in recliner)        Time: [id number] OT Time Calculation (min): 25 min  Charges: OT General Charges $OT Visit: 1 Procedure OT Treatments $Self Care/Home Management : 8-22 mins  Evette GeorgesLeonard, [place] Eva [phone] 09/17/2014, 1:19 PM

## 2014-09-17 NOTE — Progress Notes (Signed)
[facility]  Dayquan Buys EAV:[id number] DOB: 09-Dec-1955 DOA: 09/11/2014 PCP: Angela Cox, MD   Brief narrative: 58 year old male patient with known development disability, congestive heart failure, chronic respiratory failure, atrial fibrillation, sleep apnea, diabetes and seizure disorder. He resides at a skilled nursing facility. He was brought to the hospital because of altered level of consciousness and increasing shortness of breath.  Evaluation in the ER revealed hypercapnic respiratory failure. He apparently has been noncompliant with CPAP therapy. His chest x-ray also revealed a multilobar pneumonia. He was admitted to the stepdown unit.   After treatment with BiPAP and improvement in respiratory symptoms he was transferred out of the stepdown unit to the floor. After transfer he became lethargic again and was placed back on BiPAP. A pattern has emerged where he becomes drowsy and lethargic whenever he is taken off of BiPAP at hour of sleep. Pulmonary Medicine has been consulted. They felt that the patient's mentation was not driven by hypercarbia and suspected other issues especially given his history of seizure disorder. An EEG was recommended.   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  An ammonia level was checked which was elevated in the absence of significantly abnormal transaminases; lactulose was started.  On 12/9 EEG results were discussed with Dr. Roseanne Reno of Neurology when a formal consult was requested. He recommended continuing monitoring the patient and continuing current medications noting that given the absence of witnessed or visualized seizure activity that the current EEG findings are not unexpected.  HPI/Subjective: Alert and very communicative today,  making appropriate eye contact. Baseline speech is difficult to understand but representative from group home at bedside and assisting [place] with "translation".  Assessment/Plan:  Acute on chronic respiratory failure with hypercapnia -Stable now thus far on nocturnal CPAP; patient now requesting CPAP with naps and at hour of sleep -Compensated PCO2 appears to be in the 60s noting pH normal at this level.  -Pulmonary not convinced that patient's mentation issues are related to his hypercarbia   Healthcare Associated Pneumonia -Blood cultures negative -Antibiotics narrowed to Levaquin as of 12/6  Acute encephalopathy -Patient had developed a pattern of improved mentation with use of BiPAP and when does not use during sleep becomes more groggy -the symptoms have improved with consistent use of nocturnal and when necessary CPAP -CT head was unremarkable. Due to concerns about left-sided facial droop MRI was obtained which did not show any acute events. -Carbamazepine level was normal at 7.6, phenobarbital level was low at 10.8; primidone level is pending  seizure disorder -Continue with Tegretol and primidone -Tegretol level therapeutic, primidone level pending, and phenobarbital level subtherapeutic noting patient no longer on this medication -No reports of tonic clonic seizure activity recently -EEG question focal left hemispheric seizure activity; I recontacted Dr. Roseanne Reno with neurology and discussed EEG results with him. He felt that in the absence of witnessed seizure activity in a patient with known seizure disorder that these EEG findings are not unexpected.  Hyper ammoniemia -Ammonia level on 12/8 was 107 and today up to 120 -Review of up to date, offers a potential differential diagnosis of partial urea cycle enzyme deficiency: "The differential diagnosis in older children and adults who present with hyper ammoniemia includes hepatic encephalopathy in patients with advanced liver  failure, valproic acid poisoning, severe dehydration, and  gastrointestinal bacterial overgrowth. Liver function test abnormalities are seen in patients with hepatic encephalopathy and in most patients with valproic acid poisoning. Ammonia elevations are mild in patients with dehydration and bacterial overgrowth. Levels 100 to 150 mol per liter or greater should prompt a workup for a UCD." -cont lactulose and follow ammonia levels -as of 12/10 ammonia level has decreased to 96 patient noted with bowel movement after lactulose on 12/9 -Initial workup for UCD begins with checking plasma amino acids for the presence of citrulline:  A) ordered miscellaneous test [id number] Citrulline amino Acid Test; Ordered miscellaneous test Argininosuccinic acid (also known as "ASA") in the blood and urine (ordered blood)  If increased citrulline and ASA present patient has Argininosuccinic aciduria B) if low/absent citrulline then will need to check urine/blood Orotidine/orotic acid (have ordered miscellaneous test blood Orotic acid)  ; if this is normal or low the patient has a CPS deficiency; if this is high and patient has an OTC deficiency C) if has increased citrulline and absent ASA then patient has citrullinemia -additional 1st line evaluation includes checking urine organic acids and orotic acid -    Mild nonobstructive transaminitis -Carbamazepine can cause issues with hepatotoxicity so we'll continue to monitor;  presented with normal LFTs therefore doubt carbamazepine or primidone as causative  -recent trend of mild elevations in AST and ALT onset 12/7  -recently on cefepime which can cause hepatotoxicity; cefepime discontinued 12/6 -Levaquin can also cause hepatotoxicity so if LFTs continue to rise may need to discontinue this in favor of an alternative medication  congestive heart failure, unspecified type/cardiomyopathy -Currently appears compensated -Echocardiogram this admission w/ preserved LV  function, mild LVH w/ severe dilatation of left atrium and moderate dilatation of the right atrium; unfortunately study was not technically sufficient to allow evaluation for LV diastolic dysfunction. -He was not on diuretics at home.  -X-ray was more suggestive of pneumonia rather than interstitial edema therefore we are holding off diuretics for now.   chronic atrial fibrillation -Continue with his rate control medications.  -He is only on aspirin  -CHADVASc is 3 -Noted with significant biatrial dilatation  obstructive sleep apnea on CPAP -Apparently he wa noncompliant with his CPAP at the skilled nursing facility. -We'll continue to encourage use here although given his developmental disability will be very difficult to assist him in understanding the importance of using this device  -As of well/10 patient now asking for his CPAP mask  diabetes mellitus type 2 controlled on insulin -In the past 48 hours has been noted with low blood sugars in the 90 range.  -Lantus was discontinued 12/8.  -Suspect poor oral intake has contributed -Current CBGs much better controlled -Continue sliding scale coverage.  -HbA1c was 6.0 this admission.  intellectual disability -Apparent onset since birth.  -He has limited mobility and gets around in a wheelchair and was residing at skilled nursing facility prior to admission.  -He is able to communicate reasonably well with family at baseline. -note a clinical finding associated with UCD is developmental delay  Thrombocytopenia. -Heparin was discontinued 12 7. -Current platelet count subtly trending upward -Primidone and carbamazepine can cause thrombocytopenia; although low and variable platelets are relatively stable so no indication at this time to discontinue primidone or carbamazepine  DVT prophylaxis: SCDs Code Status: DO NOT RESUSCITATE Family Communication: Representative from group home at the bedside Disposition Plan/Expected LOS: Stepdown  unit   Consultants: Pulmonary medicine/Dr. Dorann LodgeYacoub-signed off 12/9 Neurology/Dr. Roseanne RenoStewart (verbal telephone consultation only)  Procedures: 2-D echocardiogram: -  Left ventricle: The cavity size was normal. Wall thickness wasincreased in a pattern of mild LVH. Systolic function was normal.The estimated ejection fraction was in the range of 60% to 65%. The study is not technically sufficient to allow evaluation of LV diastolic function.- Left atrium: Severely dilated 50 ml/m2.- Right atrium: Moderately dilated.  EEG: No electrographic seizures noted but patient had very frequent, intermittent bursts of generalized spikes that at times occurred independently and had a higher amplitude over the left hemisphere; this was reported as concerning for possible left focal hemispheric seizures  Cultures: Urine culture no growth Cultures no growth Urso PCR negative  Antibiotics: Cefepime 12/4 > 12/6 Levaquin 12/4 > Vancomycin 12/4 > 12/6  Objective: Blood pressure 139/65, pulse 80, temperature 98.8 F (37.1 C), temperature source Oral, resp. rate 26, height 5\' 2"  (1.575 m), weight 246 lb 0.5 oz (111.6 kg), SpO2 97 %.  Intake/Output Summary (Last 24 hours) at 09/17/14 1218 Last data filed at 09/17/14 1100  Gross per 24 hour  Intake    100 ml  Output   1000 ml  Net   -900 ml   Exam: Gen: No acute respiratory distress-much more alert today and according to group home staff member patient back to baseline Chest: Coarse to auscultation bilaterally without wheezes, rhonchi or crackles, room air-consistently using CPAP at hour sleep and with naps Cardiac: Regular rate and rhythm, S1-S2, no rubs murmurs or gallops, no peripheral edema, no JVD Abdomen: Soft, obese, nontender nondistended without obvious hepatosplenomegaly, no ascites Extremities: Symmetrical in appearance without cyanosis, clubbing or effusion  Scheduled Meds:  Scheduled Meds: . antiseptic oral rinse  7 mL Mouth Rinse q12n4p  .  aspirin  81 mg Oral Daily  . carbamazepine  300 mg Oral TID  . diltiazem  120 mg Oral Daily  . insulin aspart  0-15 Units Subcutaneous TID WC  . insulin aspart  0-5 Units Subcutaneous QHS  . lactulose  30 g Oral BID  . levofloxacin (LEVAQUIN) IV  500 mg Intravenous Q24H  . metoprolol tartrate  75 mg Oral BID  . primidone  200 mg Oral TID  . sodium chloride  3 mL Intravenous Q12H    Data Reviewed: Basic Metabolic Panel:  Recent Labs Lab 09/13/14 0645 09/14/14 0233 09/15/14 0301 09/16/14 0205 09/17/14 0300  NA 138 139 138 132* 138  K 4.6 4.6 4.4 4.8 4.6  CL 95* 95* 96 95* 97  CO2 35* 36* 34* 30 32  GLUCOSE 118* 93 92 132* 112*  BUN 13 15 14 11 10   CREATININE 0.39* 0.45* 0.41* 0.41* 0.47*  CALCIUM 8.8 8.4 8.7 8.9 8.9   Liver Function Tests:  Recent Labs Lab 09/11/14 1419 09/12/14 0430 09/15/14 0301 09/16/14 0205 09/17/14 0300  AST 26 26 66* 66* 81*  ALT 19 18 45 53 72*  ALKPHOS 80 71 63 70 72  BILITOT 0.4 0.6 0.6 0.5 0.5  PROT 8.7* 7.9 7.5 7.6 8.0  ALBUMIN 3.5 3.1* 3.0* 2.8* 3.0*    Recent Labs Lab 09/14/14 1953 09/16/14 0205 09/17/14 0300  AMMONIA 107* 120* 96*   CBC:  Recent Labs Lab 09/13/14 0645 09/14/14 0233 09/15/14 0301 09/16/14 0205 09/17/14 0300  WBC 5.2 4.9 4.4 5.2 5.5  HGB 15.0 14.5 14.1 14.2 15.0  HCT 47.7 45.6 44.1 44.1 46.6  MCV 101.1* 100.2* 99.1 98.2 99.1  PLT 113* 99* 102* 116* 106*   BNP (last 3 results)  Recent Labs  09/11/14 1418  PROBNP 64.9   CBG:  Recent Labs Lab 09/16/14 1207 09/16/14 1634 09/16/14 2120 09/17/14 0821 09/17/14 1133  GLUCAP 113* 115* 117* 99 137*    Recent Results (from the past 240 hour(s))  Urine culture     Status: None   Collection Time: 09/11/14  3:11 PM  Result Value Ref Range Status   Specimen Description URINE, CATHETERIZED  Final   Special Requests ADDED [id number]  Final   Culture  Setup Time   Final    09/12/2014 01:32 Performed at [facility]    Colony Count NO  GROWTH [facility]formed at [facility]   Final   Culture NO GROWTH [facility]formed at [facility]   Final   Report Status 09/13/2014 FINAL  Final  Blood Culture (routine x 2)     Status: None   Collection Time: 09/11/14  4:45 PM  Result Value Ref Range Status   Specimen Description BLOOD ARM LEFT  Final   Special Requests BOTTLES DRAWN AEROBIC AND ANAEROBIC 5CC  Final   Culture  Setup Time   Final    09/11/2014 22:25 Performed at [facility]    Culture   Final    NO GROWTH 5 DAYS Performed at [facility]    Report Status 09/17/2014 FINAL  Final  Blood Culture (routine x 2)     Status: None   Collection Time: 09/11/14  4:48 PM  Result Value Ref Range Status   Specimen Description BLOOD ARM RIGHT  Final   Special Requests BOTTLES DRAWN AEROBIC AND ANAEROBIC 5CC  Final   Culture  Setup Time   Final    09/11/2014 22:27 Performed at [facility]    Culture   Final    NO GROWTH 5 DAYS Performed at [facility]    Report Status 09/17/2014 FINAL  Final  MRSA PCR Screening     Status: None   Collection Time: 09/11/14  5:47 PM  Result Value Ref Range Status   MRSA by PCR NEGATIVE NEGATIVE Final    Comment:        The GeneXpert MRSA Assay (FDA approved for NASAL specimens only), is one component of a comprehensive MRSA colonization surveillance program. It is not intended to diagnose MRSA infection nor to guide or monitor treatment for MRSA infections.      Studies:  Recent x-ray studies have been reviewed in detail by the Attending Physician  Time spent : 40 mins   Junious Silkllison Ellis, ANP [facility] Office  [phone] Pager [phone]  On-Call/Text Page:      [email or web address]      password [email or web address]  If 7PM-7AM, please contact night-coverage [email or web address] Password [email or web address] 09/17/2014, 12:18 PM   LOS: 6 days  examined patient and discussed assessment and plan with ANP Revonda StandardAllison and agree with above. Patient with multiple  complex medical problems> 40 minutes spent in direct patient care

## 2014-09-17 NOTE — Progress Notes (Addendum)
Physical Therapy Treatment Patient Details Name: Tony CoriaMark Livas MRN: [id number] DOB: 04/15/1956 Today's Date: 09/17/2014    History of Present Illness 58 y.o. male with a past medical history of mental retardation, congestive heart failure of unknown type, chronic respiratory failure, atrial fibrillation, obstructive sleep apnea, diabetes mellitus type 2, seizure disorder who lives in a skilled nursing facility and was brought in due to decreased level of consciousness and worsening shortness of breath. He was noted to have hypercapnic respiratory failure. Pt found to have PNA and placed on bipap.    PT Comments    Pt making good progress.  Follow Up Recommendations  SNF     Equipment Recommendations  None recommended by PT    Recommendations for Other Services       Precautions / Restrictions Precautions Precautions: Fall Restrictions Weight Bearing Restrictions: No    Mobility  Bed Mobility Overal bed mobility: Needs Assistance;+2 for physical assistance Bed Mobility: Supine to Sit Rolling: +2 for physical assistance   Supine to sit: Max assist;+2 for physical assistance        Transfers Overall transfer level: Needs assistance Equipment used: 2 person hand held assist Transfers: Sit to/from [employer];Stand Pivot Transfers Sit to Stand: Mod assist;+2 physical assistance Stand pivot transfers: Mod assist;+2 physical assistance       General transfer comment: Wide base of support and pt taking shuffling pivotal steps.  Ambulation/Gait                 Stairs            Wheelchair Mobility    Modified Rankin (Stroke Patients Only)       Balance Overall balance assessment: Needs assistance Sitting-balance support: No upper extremity supported;Feet supported Sitting balance-Leahy Scale: Fair Sitting balance - Comments: Sat EOB x 10 minutes with supervision to min guard A.   Standing balance support: Bilateral upper extremity supported Standing  balance-Leahy Scale: Poor Standing balance comment: 2 person hand held assist for static standing.                    Cognition Arousal/Alertness: Awake/alert Behavior During Therapy:  (He smiled once we got him to the recliner, up until that point flat affect) Overall Cognitive Status: Impaired/Different from baseline Area of Impairment: Following commands;Safety/judgement;Problem solving   Current Attention Level: Sustained   Following Commands: Follows one step commands inconsistently;Follows one step commands with increased time     Problem Solving: Slow processing;Requires verbal cues;Requires tactile cues General Comments: At baseline pt able to converse and make needs known.     Exercises      General Comments        Pertinent Vitals/Pain Pain Assessment: Faces Faces Pain Scale: No hurt    Home Living                      Prior Function            PT Goals (current goals can now be found in the care plan section) Progress towards PT goals: Progressing toward goals    Frequency  Min 2X/week    PT Plan Current plan remains appropriate    Co-evaluation PT/OT/SLP Co-Evaluation/Treatment: Yes Reason for Co-Treatment: For patient/therapist safety PT goals addressed during session: Mobility/safety with mobility;Balance OT goals addressed during session: ADL's and self-care     End of Session   Activity Tolerance: Patient tolerated treatment well Patient left: in chair;with call bell/phone within reach;with chair alarm set  Time: [id number] PT Time Calculation (min) (ACUTE ONLY): 21 min  Charges:  $Gait Training: 8-22 mins                    G Codes:      Teondra Newburg 09/17/2014, 4:07 PM  [employer]back PT [phone]

## 2014-09-18 LAB — COMPREHENSIVE METABOLIC PANEL
ALT: 73 U/L — AB (ref 0–53)
AST: 73 U/L — ABNORMAL HIGH (ref 0–37)
Albumin: 2.9 g/dL — ABNORMAL LOW (ref 3.5–5.2)
Alkaline Phosphatase: 76 U/L (ref 39–117)
Anion gap: 10 (ref 5–15)
BILIRUBIN TOTAL: 0.5 mg/dL (ref 0.3–1.2)
BUN: 9 mg/dL (ref 6–23)
CHLORIDE: 98 meq/L (ref 96–112)
CO2: 30 meq/L (ref 19–32)
Calcium: 8.9 mg/dL (ref 8.4–10.5)
Creatinine, Ser: 0.38 mg/dL — ABNORMAL LOW (ref 0.50–1.35)
GFR calc Af Amer: >90 mL/min (ref >90)
GFR calc non Af Amer: >90 mL/min (ref >90)
Glucose, Bld: 141 mg/dL — ABNORMAL HIGH (ref 70–99)
Potassium: 4.4 mEq/L (ref 3.7–5.3)
Sodium: 138 mEq/L (ref 137–147)
Total Protein: 7.6 g/dL (ref 6.0–8.3)

## 2014-09-18 LAB — GLUCOSE, CAPILLARY
GLUCOSE-CAPILLARY: 135 mg/dL — AB (ref 70–99)
Glucose-Capillary: 111 mg/dL — ABNORMAL HIGH (ref 70–99)

## 2014-09-18 LAB — CBC
HEMATOCRIT: 43.9 % (ref 39.0–52.0)
Hemoglobin: 14.1 g/dL (ref 13.0–17.0)
MCH: 31.6 pg (ref 26.0–34.0)
MCHC: 32.1 g/dL (ref 30.0–36.0)
MCV: 98.4 fL (ref 78.0–100.0)
Platelets: 109 10*3/uL — ABNORMAL LOW (ref 150–400)
RBC: 4.46 MIL/uL (ref 4.22–5.81)
RDW: 13.3 % (ref 11.5–15.5)
WBC: 5.5 10*3/uL (ref 4.0–10.5)

## 2014-09-18 LAB — AMMONIA: Ammonia: 91 umol/L — ABNORMAL HIGH (ref 11–60)

## 2014-09-18 MED ORDER — LACTULOSE 10 GM/15ML PO SOLN: 30.0000 g | Freq: Two times a day (BID) | ORAL | Status: AC

## 2014-09-18 MED ORDER — LEVOFLOXACIN 500 MG PO TABS: 500.0000 mg | ORAL_TABLET | Freq: Every day | ORAL | Status: DC

## 2014-09-18 MED ORDER — LEVOFLOXACIN 500 MG PO TABS: 500.0000 mg | ORAL_TABLET | Freq: Every day | ORAL | Status: AC

## 2014-09-18 MED ORDER — RESOURCE THICKENUP CLEAR PO POWD: ORAL | Status: DC

## 2014-09-18 NOTE — Progress Notes (Addendum)
CSW (Clinical Child psychotherapistocial Worker) prepared pt dc packet and placed on pt shadow chart. Pt nurse agreeable to calling [facility] when ready for dc. CSW spoke with pt sister-in-law and notified. Facility also aware. CSW signing off.  ADDENDUM 3:25pm: CSW received call from facility. They are requesting clarification on diet in discharge summary. CSW notified pt nurse. CSW to send updated dc summary to facility when available.  ADDENDUM 3:42pm: CSW sent updated dc summary to facility. Notified facility of new summary being sent electronically and dc packet copy is not updated.  Indra Wolters, LCSWA [phone]

## 2014-09-18 NOTE — Discharge Summary (Addendum)
Physician Discharge Summary  Tony Terry WUJ:[id number] DOB: [birth year] DOA: 09/11/2014  PCP: Angela Cox, MD  Admit date: 09/11/2014 Discharge date: 09/18/2014  Time spent: >30 minutes  Recommendations for Outpatient Follow-up:  1. Has maintained excellent glycemic control on SSI alone - Lantus and Actos not resumed at discharge  2. Plan discharge back to [facility] 3. Recommend repeat ammonia level Monday Dec 14 and at least weekly for several weeks - on BID Lactulose and may be able to decrease dose and frequency soon 4. Blood work to clarify if has a partial Urea Cycle Disorder (UCD) obtained but was "send out" so results were pending at time of discharge. Please follow up on these results in the event any positive findings may require a change in patient management. (for specifics see narrative below) 5. Please remind patient he MUST be compliant with CPAP every night to avoid hypercarbic respiratory failure and thus a return trip to the ER. 6. Continue Levaquin for PNA for 2 days after discharge 7. Check CBGs AC and HS 8. Will need a repeat hepatic function panel on Monday December 14  Discharge Diagnoses:    HCAP (healthcare-associated pneumonia)   Atrial fibrillation-now in NSR   Congestive heart failure-resolved   Intellectual disability   Acute on chronic respiratory failure with hypercapnia   DM type 2 -HgbA1c 6.0   Seizure disorder   Acute encephalopathy due to hypercapnia and elevated ammonia levels   Increased ammonia level-? Due to UCD   Transaminitis-? Etiology, nonobstructive   OSA on CPAP   Thrombocytopenia   Discharge Condition: stable  Diet recommendation: Carbohydrate modified, dysphagia 3, thin liquids, no thickeners, may use straws  Filed Weights   09/15/14 0300 09/17/14 0254 09/18/14 0300  Weight: 249 lb 1.9 oz (113 kg) 246 lb 0.5 oz (111.6 kg) 248 lb 10.9 oz (112.8 kg)    History of present illness:  58 year old male patient with known  development disability, congestive heart failure, chronic respiratory failure, atrial fibrillation, sleep apnea, diabetes and seizure disorder. He resides at a skilled nursing facility. He was brought to the hospital because of altered level of consciousness and increasing shortness of breath.  Evaluation in the ER revealed hypercapnic respiratory failure. He reportedly had been noncompliant with CPAP therapy. His chest x-ray revealed a multilobar pneumonia. He was admitted to the stepdown unit.   After treatment with BiPAP and improvement in respiratory symptoms he was transferred out of the stepdown unit to the floor. After transfer he became lethargic again and was placed back on BiPAP. A pattern emerged where he became drowsy and lethargic whenever he was taken off of BiPAP at hour of sleep. Pulmonary Medicine was consulted. They felt that the patient's mentation was not driven by hypercarbia and suspected other issues especially given his history of seizure disorder.   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  EEG results were discussed with Dr. Roseanne Reno of Neurology when a formal consult was requested. He recommended continuing monitoring the patient and continuing current medications noting that given the absence of witnessed or visualized seizure activity that the current EEG findings are not unexpected. An ammonia level was checked which was elevated in the absence of significantly abnormal transaminases; lactulose was started.    Hospital Course:   Acute on chronic respiratory failure with hypercapnia -Stable now that pt compliant with  nocturnal CPAP; patient now requesting CPAP with naps and at hour of sleep -Compensated PCO2 appears to be in the 60s noting pH normal at this level.  -Pulmonary not convinced that patient's  mentation issues were related to his hypercarbia   Healthcare Associated Pneumonia -Blood cultures negative -Antibiotics narrowed to Levaquin as of 12/6  Acute encephalopathy -Patient had developed a pattern of improved mentation with use of BiPAP and when does not use during sleep becomes more groggy -the symptoms have improved with consistent use of nocturnal and when necessary CPAP -CT head was unremarkable. Due to concerns about left-sided facial droop MRI was obtained which did not show any acute events.  seizure disorder -Continue Tegretol and primidone -Tegretol level therapeutic and phenobarbital level subtherapeutic noting patient no longer on this medication -No reports of tonic clonic seizure activity > 72 hours -EEG questioned focal left hemispheric seizure activity; we recontacted Dr. Roseanne Reno with neurology and discussed EEG results with him. He felt that in the absence of witnessed seizure activity in a patient with known seizure disorder that these EEG findings are not unexpected.  Hyper ammoniemia -Ammonia level on 12/8 was 107 and peaked up to 120-on date of discharge was down to 91- will continue Lactulose after discharge -Review of up to date, offers a potential differential diagnosis of partial urea cycle enzyme deficiency: "The differential diagnosis in older children and adults who present with hyper ammoniemia includes hepatic encephalopathy in patients with advanced liver failure, valproic acid poisoning, severe dehydration, and gastrointestinal bacterial overgrowth. Liver function test abnormalities are seen in patients with hepatic encephalopathy and in most patients with valproic acid poisoning. Ammonia elevations are mild in patients with dehydration and bacterial overgrowth. Levels 100 to 150 mol per liter or greater should prompt a workup for a UCD." -cont lactulose and follow ammonia levels -as of 12/10 ammonia level has decreased to 96 patient noted with bowel  movement after lactulose on 12/9 -Initial workup for UCD begins with checking plasma amino acids for the presence of citrulline:  A) ordered miscellaneous test [id number] Citrulline amino Acid Test; Ordered miscellaneous test Argininosuccinic acid (also known as "ASA") in the blood and urine (ordered blood) > If increased citrulline and ASA present patient has Argininosuccinic aciduria B) if low/absent citrulline then will need to check urine/blood Orotidine/orotic acid (have ordered miscellaneous test blood Orotic acid); if this is normal or low the patient has a CPS deficiency; if this is high and patient has an OTC deficiency C) if has increased citrulline and absent ASA then patient has citrullinemia -additional 1st line evaluation includes checking urine organic acids and orotic acid  Mild nonobstructive transaminitis -presented with normal LFTs therefore doubt carbamazepine or primidone as causative  -recent trend of mild elevations in AST and ALT onset 12/7  -recently on cefepime which can cause hepatotoxicity; cefepime discontinued 12/6 -as of 12/11 LFTs were trending downward with AST 73 and ALT 73  congestive heart failure, unspecified type/cardiomyopathy -Compensated -Echocardiogram this admission w/ preserved LV function, mild LVH w/ severe dilatation of left atrium and moderate dilatation of the right atrium; unfortunately study was not technically sufficient to allow evaluation for LV diastolic dysfunction. -He was not on diuretics at home.  -X-ray was more suggestive of pneumonia rather than interstitial edema   chronic atrial fibrillation -Continue with his rate control medications.  -He is only on aspirin  -CHADVASc is 3 -Noted with significant biatrial dilatation  obstructive sleep apnea on CPAP -Reportedly he was noncompliant with his CPAP  at the skilled nursing facility. -Patient now asking for his CPAP mask  diabetes mellitus type 2 controlled on insulin -In  the past 48 hours has been noted with blood sugars in the 90 range - Lantus was discontinued 12/8.  -Suspect poor oral intake has contributed -HbA1c was 6.0 this admission. -CBGs have been well controlled off preadmission Lantus and Actos so will discharge on SSI only -follow CBG AC and HS  intellectual disability -Apparent onset since birth.  -He has limited mobility and gets around in a wheelchair and was residing at group home prior to admission.  -He is able to communicate reasonably well with family at baseline. -note a clinical finding associated with UCD is developmental delay  Thrombocytopenia. -Heparin was discontinued 12/7 -Current platelet count subtly trending upward -Primidone and carbamazepine can cause thrombocytopenia; although low and variable platelets are relatively stable so no indication at this time to discontinue primidone or carbamazepine  Procedures: 2-D echocardiogram: - Left ventricle: The cavity size was normal. Wall thickness wasincreased in a pattern of mild LVH. Systolic function was normal.The estimated ejection fraction was in the range of 60% to 65%. The study is not technically sufficient to allow evaluation of LV diastolic function.- Left atrium: Severely dilated 50 ml/m2.- Right atrium: Moderately dilated.  EEG: No electrographic seizures noted but patient had very frequent, intermittent bursts of generalized spikes that at times occurred independently and had a higher amplitude over the left hemisphere; this was reported as concerning for possible left focal hemispheric seizures  Consultations: Pulmonary medicine/Dr. Dorann Lodge off 12/9 Neurology/Dr. Roseanne Reno (verbal telephone consultation only)  Discharge Exam: Filed Vitals:   09/18/14 1103  BP: 145/72  Pulse: 87  Temp: 98.7 F (37.1 C)  Resp: 23   Gen: No acute respiratory distress Chest: Coarse to auscultation bilaterally without wheezes, rhonchi or crackles, room air-consistently  using CPAP at hour sleep  Cardiac: Regular rate and rhythm, S1-S2, no peripheral edema, no JVD Abdomen: Soft, obese, nontender nondistended without obvious hepatosplenomegaly, no ascites Extremities: Symmetrical in appearance without cyanosis, clubbing or effusion  Discharge Instructions    Diet Carb Modified    Complete by:  As directed      Diet Carb Modified    Complete by:  As directed   Dysphagia 3 , thin liquids, no thickeners, may use straws     Increase activity slowly    Complete by:  As directed      Increase activity slowly    Complete by:  As directed           Current Discharge Medication List    START taking these medications   Details  lactulose (CHRONULAC) 10 GM/15ML solution Take 45 mLs (30 g total) by mouth 2 (two) times daily. Qty: 240 mL, Refills: 0    levofloxacin (LEVAQUIN) 500 MG tablet Take 1 tablet (500 mg total) by mouth daily. Qty: 3 tablet, Refills: 0      CONTINUE these medications which have NOT CHANGED   Details  aspirin 81 MG chewable tablet Take 1 tablet by mouth daily for A-Fib  * use house stock*    carbamazepine (TEGRETOL) 200 MG tablet Take 300 mg by mouth 3 (three) times daily. 1 and 1/2 tablets =300mg     diltiazem (CARDIZEM) 120 MG tablet Take 1 capsule by mouth daily for HTN    insulin aspart (NOVOLOG) 100 UNIT/ML injection Inject into the skin. Check BS before meals if 100-150=5 units, if 151 and greater = 10 units  * No  Bedtime Dose* for DM *Do Not mix with other insulins*  Expires 28 days after opening.  *check expiration date*    metoprolol tartrate (LOPRESSOR) 25 MG tablet Take 75 mg by mouth 2 (two) times daily. **Hold for pulse </=50 and SBP</= 100**    primidone (MYSOLINE) 50 MG tablet Take 200 mg by mouth 3 (three) times daily.     ranitidine (ZANTAC) 75 MG tablet Take 75 mg by mouth at bedtime. For GERD    sennosides-docusate sodium (SENOKOT-S) 8.6-50 MG tablet Take 1 tabley by mouth every night at bedtime for constipation   * Use House Stock*    traMADol (ULTRAM) 50 MG tablet Take 1 tablet by mouth every 4 hours as needed for pain Qty: 180 tablet, Refills: 5    Vitamin D, Ergocalciferol, (DRISDOL) 50000 UNITS CAPS capsule Take 50,000 Units by mouth every 7 (seven) days. MONDAY      STOP taking these medications     insulin glargine (LANTUS) 100 UNIT/ML injection      pioglitazone (ACTOS) 30 MG tablet        No Known Allergies Follow-up Information    Follow up with Angela Cox, MD.   Specialty:  Internal Medicine     Significant Diagnostic Studies: Ct Head Wo Contrast  09/12/2014   CLINICAL DATA:  Shortness of breath and increased C02 levels. History of developmental disorder.  EXAM: CT HEAD WITHOUT CONTRAST  TECHNIQUE: Contiguous axial images were obtained from the base of the skull through the vertex without intravenous contrast.  COMPARISON:  Head CT 06/17/2013.  FINDINGS: There is no evidence of acute intracranial hemorrhage, mass lesion, brain edema or extra-axial fluid collection. The ventricles and subarachnoid spaces are appropriately sized for age. There is no CT evidence of acute cortical infarction.  The visualized paranasal sinuses, mastoid air cells and middle ears are clear. Calvarial hyperostosis noted. No acute osseous findings demonstrated.  IMPRESSION: Stable noncontrast head CT.  No acute intracranial findings.   Electronically Signed   By: Roxy Horseman M.D.   On: 09/12/2014 12:10   Mr Brain Wo Contrast  09/13/2014   CLINICAL DATA:  New onset Altered level of consciousness for this 58 year old male with mental retardation, atrial fibrillation, diabetes, and seizures. Hypercapnic respiratory failure. Initial encounter.  EXAM: MRI HEAD WITHOUT CONTRAST  TECHNIQUE: Multiplanar, multiecho pulse sequences of the brain and surrounding structures were obtained without intravenous contrast.  COMPARISON:  CT head 09/12/2014.  FINDINGS: The patient was unable to remain motionless for the  exam. Small or subtle lesions could be overlooked.  No evidence for acute infarction, hemorrhage, mass lesion, hydrocephalus, or extra-axial fluid. Mild atrophy. Minor white matter signal abnormality, nonspecific. Flow voids are maintained throughout the carotid, basilar, and vertebral arteries. No midline abnormality. Small microbleed LEFT posterior temporal subcortical white matter likely chronic, of doubtful significance. Flow voids are maintained. Extracranial soft tissues grossly unremarkable.  IMPRESSION: Motion degraded exam.  No acute intracranial abnormality.  Mild atrophy.   Electronically Signed   By: Davonna Belling M.D.   On: 09/13/2014 13:16   Dg Chest Port 1 View  09/13/2014   CLINICAL DATA:  Hypoxia  EXAM: PORTABLE CHEST - 1 VIEW  COMPARISON:  09/11/2014  FINDINGS: There is patchy lingular and left lower lobe airspace disease. There is no pleural effusion or pneumothorax. Right lung is clear. There is stable cardiomegaly there is prominence of the pulmonary vasculature. There is no acute osseous abnormality.  IMPRESSION: Patchy lingular and left lower lobe airspace  disease which may reflect multifocal atelectasis versus pneumonia.   Electronically Signed   By: Elige KoHetal  Patel   On: 09/13/2014 17:42   Dg Chest Portable 1 View  09/11/2014   CLINICAL DATA:  58 year old male with elevated CO2 levels today, presenting with generalized malaise and tremors.  EXAM: PORTABLE CHEST - 1 VIEW  COMPARISON:  Chest x-ray 06/13/2005.  FINDINGS: Extensive airspace consolidation in the left lower lobe and lingula, concerning for multilobar pneumonia. Cephalization of the pulmonary vasculature with slight indistinctness of the interstitial markings. Cardiomegaly. Upper mediastinal contours are distorted by patient's rotation to the right, but there is left hilar prominence. Possible small left pleural effusion.  IMPRESSION: 1. The appearance the chest suggests multilobar pneumonia, most severe in the left lower lobe  and lingula. Repeat standing PA and lateral chest radiographs in 2-3 weeks after trial of antimicrobial therapy is strongly recommended to ensure resolution of these findings (i.e., to exclude a centrally obstructing lesion). 2. In addition, there is evidence of at least pulmonary venous congestion, if not mild interstitial pulmonary edema.   Electronically Signed   By: Trudie Reedaniel  Entrikin M.D.   On: 09/11/2014 15:39    Microbiology: Recent Results (from the past 240 hour(s))  Urine culture     Status: None   Collection Time: 09/11/14  3:11 PM  Result Value Ref Range Status   Specimen Description URINE, CATHETERIZED  Final   Special Requests ADDED [phone]  Final   Culture  Setup Time   Final    09/12/2014 01:32 [facility]formed at [facility]    Colony Count NO GROWTH [facility]formed at [facility]   Final   Culture NO GROWTH Performed at [facility]   Final   Report Status 09/13/2014 FINAL  Final  Blood Culture (routine x 2)     Status: None   Collection Time: 09/11/14  4:45 PM  Result Value Ref Range Status   Specimen Description BLOOD ARM LEFT  Final   Special Requests BOTTLES DRAWN AEROBIC AND ANAEROBIC 5CC  Final   Culture  Setup Time   Final    09/11/2014 22:25 Performed at [facility]    Culture   Final    NO GROWTH 5 DAYS Performed at [facility]    Report Status 09/17/2014 FINAL  Final  Blood Culture (routine x 2)     Status: None   Collection Time: 09/11/14  4:48 PM  Result Value Ref Range Status   Specimen Description BLOOD ARM RIGHT  Final   Special Requests BOTTLES DRAWN AEROBIC AND ANAEROBIC 5CC  Final   Culture  Setup Time   Final    09/11/2014 22:27 Performed at [facility]    Culture   Final    NO GROWTH 5 DAYS Performed at [facility]    Report Status 09/17/2014 FINAL  Final  MRSA PCR Screening     Status: None   Collection Time: 09/11/14  5:47 PM  Result Value Ref Range Status   MRSA by PCR  NEGATIVE NEGATIVE Final    Comment:        The GeneXpert MRSA Assay (FDA approved for NASAL specimens only), is one component of a comprehensive MRSA colonization surveillance program. It is not intended to diagnose MRSA infection nor to guide or monitor treatment for MRSA infections.      Labs: Basic Metabolic Panel:  Recent Labs Lab 09/14/14 0233 09/15/14 0301 09/16/14 0205 09/17/14 0300 09/18/14 0344  NA 139  138 132* 138 138  K 4.6 4.4 4.8 4.6 4.4  CL 95* 96 95* 97 98  CO2 36* 34* 30 32 30  GLUCOSE 93 92 132* 112* 141*  BUN 15 14 11 10 9   CREATININE 0.45* 0.41* 0.41* 0.47* 0.38*  CALCIUM 8.4 8.7 8.9 8.9 8.9   Liver Function Tests:  Recent Labs Lab 09/12/14 0430 09/15/14 0301 09/16/14 0205 09/17/14 0300 09/18/14 0344  AST 26 66* 66* 81* 73*  ALT 18 45 53 72* 73*  ALKPHOS 71 63 70 72 76  BILITOT 0.6 0.6 0.5 0.5 0.5  PROT 7.9 7.5 7.6 8.0 7.6  ALBUMIN 3.1* 3.0* 2.8* 3.0* 2.9*    Recent Labs Lab 09/14/14 1953 09/16/14 0205 09/17/14 0300 09/18/14 0344  AMMONIA 107* 120* 96* 91*   CBC:  Recent Labs Lab 09/14/14 0233 09/15/14 0301 09/16/14 0205 09/17/14 0300 09/18/14 0344  WBC 4.9 4.4 5.2 5.5 5.5  HGB 14.5 14.1 14.2 15.0 14.1  HCT 45.6 44.1 44.1 46.6 43.9  MCV 100.2* 99.1 98.2 99.1 98.4  PLT 99* 102* 116* 106* 109*   BNP: BNP (last 3 results)  Recent Labs  09/11/14 1418  PROBNP 64.9   CBG:  Recent Labs Lab 09/17/14 1133 09/17/14 1714 09/17/14 2108 09/18/14 0748 09/18/14 1101  GLUCAP 137* 132* 163* 111* 135*    Signed:  Markian Glockner L. ANP [facility] 09/18/2014, 3:34 PM  I have personally examined this patient and reviewed the entire database. I have reviewed the above note, made any necessary editorial changes, and agree with its content.  Lonia BloodJeffrey T. McClung, MD [facility]

## 2014-09-18 NOTE — Discharge Instructions (Signed)
Hepatic Encephalopathy-Non cirrhotic-UCD suspected Hepatic encephalopathy is a syndrome. This is a set of symptoms that occur together. It is seen mostly in patients with damage to the liver known as cirrhosis. This is where normal liver tissue has been replaced by scar tissue.  Symptoms of the syndrome include:  Changes in personality.  Mental impairment.  A depressed level of consciousness. These changes occur because toxins build up in the bloodstream. The build up occurs because the scarred liver cannot rid toxins from the body. The most important of these toxins is ammonia. Toxins can cause abnormal behavior and confusion. Toxins in the blood stream can impair your ability to take care of yourself or others. Some people become very sleepy and cannot be woken easily. In severe cases, the patient lapses into a coma.  CAUSES  There are many things that can cause liver damage that can lead to buildup of toxins. These include:  Diseases that cause cirrhosis of the liver.  Long-term alcohol use with progressive liver damage.  Hepatitis B or C with ongoing infection and liver damage.  Patients without cirrhosis who have undergone shunt surgery.  Kidney failure.  Bleeding in the stomach or intestines.  Infection.  Constipation.  Medications that act upon the central nervous system.  Diuretic therapy.  Excessive dietary protein. SYMPTOMS  Symptoms of this syndrome are categorized or "staged" based on severity.   Stage 0. Minimal hepatic encephalopathy. No detectable changes in personality or behavior. Minimal changes in memory, concentration, mental function, and physical ability.  Stage 1. Some lack of awareness. Shortened attention span. Problems with addition or subtraction. Possible problems with sleeping or a reversal of the normal sleep pattern. Euphoria, depression, or irritability may be present. Mild confusion. Slowing of mental ability. Tremors may be detected.  Stage  2. Lethargy or apathy. Disoriented. Strange behavior. Slurred speech. Obvious tremors. Drowsiness, unable to perform mental tasks. Personality changes, and confusion about time.  Stage 3. Very sleepy but can be aroused. Unable to perform mental tasks, cannot keep track of time and place, marked confusion, amnesia, occasional fits of rage, speech cannot be understood.  Stage 4. Coma with or without response to painful stimuli. DIAGNOSIS  In mild cases, a careful history and physical exam may lead your caregiver to consider possible mild hepatic encephalopathy as the cause of symptoms. The diagnosis is clearer in more severe cases. An elevated blood ammonia level is the classic blood test abnormality in patients with this syndrome. Other tests can be helpful to rule out other diseases.  TREATMENT   Medications are often used to lower the ammonia level in the blood. This usually leads to improvement.  Diets containing vegetable proteins are better than diets rich in animal protein, especially proteins derived from red meats. Eating well-cooked chicken and fish in addition to vegetable protein should be discussed with your caregiver. Malnourished patients are encouraged to add liquid nutritional supplements to their diet.  Antibiotics are sometimes used to try to lessen the volume of bacteria in the intestines that produce ammonia.  Moderate to severe cases of this syndrome usually require a hospital stay and medicine that is given directly into a vein (intravenously). HOME CARE INSTRUCTIONS  The goal at home is to avoid things that can make the condition worse and lead to a buildup of ammonia in the blood.  Eat a well balanced diet. Your caregiver can help you with suggestions on this.  Talk to your caregiver before taking vitamin supplements. Large doses of vitamins  and minerals, especially vitamin A, iron, or copper, can worsen liver damage.  A low salt diet, water restriction, or diuretic  medicine may be needed to reduce fluid retention.  Avoid alcohol and acetaminophen as well as any over-the-counter medications that contain acetaminophen (check labels). Only take over-the-counter or prescription medicines for pain, discomfort, or fever as directed by your caregiver.  Avoid drugs that are toxic to the liver. Review your medications (both prescription and non-prescription) with your caregiver to make sure those you are taking will not be harmful.  Blood tests may be needed. Follow your caregiver's advice regarding the timing of these.  With this condition you play a critical role in maintaining your own good health. The failure to follow your caregiver's advice and these instructions may result in permanent disability or death. SEEK MEDICAL CARE IF:   You have increasing fatigue or weakness.  You develop increasing swelling of the abdomen, hands, feet, legs or face.  You develop loss of appetite.  You are feeling sick to your stomach (nausea) and vomiting.  You develop jaundice. This is a yellow discoloration of the skin.  You develop worsening problems with concentration, confusion, and/or problems with sleep. SEEK IMMEDIATE MEDICAL CARE IF:   You vomit bright red blood or a coffee ground-looking material.  You have blood in your stools. Or the stools turn black and tarry.  You have a fever.  You develop easy bruising or bleeding.  You have a return of slurred speech, change in behavior, or confusion. MAKE SURE YOU:   Understand these instructions.  Will watch your condition.  Will get help right away if you are not doing well or get worse. Document Released: 12/05/2006 Document Revised: 12/18/2011 Document Reviewed: 09/11/2007 [facility] Patient Information 2015 [place], [place]. This information is not intended to replace advice given to you by your health care provider. Make sure you discuss any questions you have with your health care provider. Lactulose  oral solution What is this medicine? LACTULOSE (LAK tyoo lose) is a laxative derived from lactose. It helps to treat chronic constipation and to treat or prevent hepatic encephalopathy or coma. These are brain disorders that result from liver disease. This medicine may be used for other purposes; ask your health care provider or pharmacist if you have questions. COMMON BRAND NAME(S): Acilac, Cephulac, Cholac, Chronulac, Constilac, Constulose, Enulose, Generlac What should I tell my health care provider before I take this medicine? They need to know if you have any of these conditions: -need a galactose-free diet -scheduled for surgery -an unusual or allergic reaction to lactulose, other sugars, medicines, foods, dyes or preservatives -pregnant or trying to get pregnant -breast-feeding How should I use this medicine? Take this medicine mouth. Follow the directions on the prescription label. Shake well before using. Use a specially marked spoon or container to measure your medicine. Ask your pharmacist if you do not have one. Household spoons are not accurate. Take your doses at regular intervals. Do not take your medicine more often than directed. You may be directed to take this medicine rectally. If so, you must follow specific directions from your doctor or healthcare professional. Please contact him or her. Talk to your pediatrician regarding the use of this medicine in children. Special care may be needed. Overdosage: If you think you have taken too much of this medicine contact a poison control center or emergency room at once. NOTE: This medicine is only for you. Do not share this medicine with others. What if  I miss a dose? If you miss a dose, take it as soon as you can. If it is almost time for your next dose, take only that dose. Do not take double or extra doses. What may interact with this medicine? -antacids -neomycin -other laxatives This list may not describe all possible  interactions. Give your health care provider a list of all the medicines, herbs, non-prescription drugs, or dietary supplements you use. Also tell them if you smoke, drink alcohol, or use illegal drugs. Some items may interact with your medicine. What should I watch for while using this medicine? This medicine may not produce any result for 24 to 48 hours. Do not take this medicine for longer than directed by your doctor or health care professional. Drink plenty of water with each dose of this medicine. What side effects may I notice from receiving this medicine? Side effects that you should report to your doctor or health care professional as soon as possible: -diarrhea Side effects that usually do not require medical attention (report to your doctor or health care professional if they continue or are bothersome): -belching, flatulence -nausea or vomiting -stomach pain or discomfort This list may not describe all possible side effects. Call your doctor for medical advice about side effects. You may report side effects to FDA at 1-800-FDA-1088. Where should I keep my medicine? Keep out of the reach of children. This medicine may darken in color under normal storage conditions. This is because of the sugar solution and does not affect the way the medicine works. If the solution becomes extremely dark in color, contact your health care professional before use. Store at room temperature between 15 and 30 degrees C (59 and 86 degrees F). Do not freeze. Keep container tightly closed. Throw away any unused medicine after the expiration date. NOTE: This sheet is a summary. It may not cover all possible information. If you have questions about this medicine, talk to your doctor, pharmacist, or health care provider.  2015, Elsevier/Gold Standard. (2008-03-31 16:04:57) Ammonia, Plasma Ammonia This is a test to detect elevated levels of ammonia in the blood, to evaluate changes in consciousness, or to help  diagnose hepatic encephalopathy and Reye syndrome. It may be ordered when a patient experiences mental changes or lapses into a coma of unknown origin, if an infant or child experiences frequent vomiting and increased lethargy as a newborn, or about a week after a viral illness. Ammonia is a compound produced by intestinal bacteria and by cells in the body during the digestion of protein. Ammonia is a waste product that the liver changes into urea and glutamine. The urea is then carried by the blood to the kidneys, where it is put out in the urine. If this "urea cycle" does not complete, ammonia builds up in the blood. This also happens when you cannot put out urine (kidney failure) or when your liver does not work (hepatic failure). A buildup of ammonia in the body can cause mental and neurological changes that can lead to confusion, disorientation, sleepiness, and eventually to coma and even death. Infants and children with increased ammonia levels may vomit frequently, be irritable, and be increasingly lethargic. Left untreated, they may experience seizures, respiratory difficulty, and may lapse into a coma and die. PREPARATION FOR TEST No preparation or fasting is necessary. A blood sample is taken by a needle from a vein.  Avoid exercising before this test. NORMAL FINDINGS  Normal values depend on the method used for testing. Test  results depend on many factors including age, sex, etc. Your lab report should include the specific reference range for your test. Your caregiver will go over you test results with you.  Adults: 10-80 mcg/dL (1-306-47 micromole/L)  Neonates, 0 to 10 days (enzymatic): 170-341 mcg/dL (865-784100-200 micromole/L)  Infants and toddlers, 10 days to 2 years (enzymatic): 68-136 mcg/dL (69-6240-80 micromole/L)  Children, older than 2 years (enzymatic): 19-60 mcg/dL (95-2811-35 micromole/L) Ranges for normal findings may vary among different laboratories and hospitals. You should always check with  your doctor after having lab work or other tests done to discuss the meaning of your test results and whether your values are considered within normal limits. MEANING OF TEST  Your caregiver will go over the test results with you and discuss the importance and meaning of your results, as well as treatment options and the need for additional tests if necessary. OBTAINING THE TEST RESULTS  It is your responsibility to obtain your test results. Ask the lab or department performing the test when and how you will get your results. Document Released: 10/17/2004 Document Revised: 02/09/2014 Document Reviewed: 08/31/2008 [facility] Patient Information 2015 [place], [place]. This information is not intended to replace advice given to you by your health care provider. Make sure you discuss any questions you have with your health care provider.

## 2014-09-20 ENCOUNTER — Emergency Department (HOSPITAL_COMMUNITY): Payer: Medicare Other

## 2014-09-20 ENCOUNTER — Emergency Department (HOSPITAL_COMMUNITY)
Admission: EM | Admit: 2014-09-20 | Discharge: 2014-09-20 | Disposition: A | Discharge status: Home / Self Care | Payer: Medicare Other | Attending: Emergency Medicine | Admitting: Emergency Medicine

## 2014-09-20 ENCOUNTER — Encounter (HOSPITAL_COMMUNITY): Payer: Self-pay | Admitting: Emergency Medicine

## 2014-09-20 DIAGNOSIS — Y92128 Other place in nursing home as the place of occurrence of the external cause: Secondary | ICD-10-CM | POA: Insufficient documentation

## 2014-09-20 DIAGNOSIS — G40909 Epilepsy, unspecified, not intractable, without status epilepticus: Secondary | ICD-10-CM | POA: Diagnosis not present

## 2014-09-20 DIAGNOSIS — S92512B Displaced fracture of proximal phalanx of left lesser toe(s), initial encounter for open fracture: Secondary | ICD-10-CM | POA: Diagnosis not present

## 2014-09-20 DIAGNOSIS — Z8709 Personal history of other diseases of the respiratory system: Secondary | ICD-10-CM | POA: Diagnosis not present

## 2014-09-20 DIAGNOSIS — W231XXA Caught, crushed, jammed, or pinched between stationary objects, initial encounter: Secondary | ICD-10-CM | POA: Diagnosis not present

## 2014-09-20 DIAGNOSIS — Z794 Long term (current) use of insulin: Secondary | ICD-10-CM | POA: Insufficient documentation

## 2014-09-20 DIAGNOSIS — S91115A Laceration without foreign body of left lesser toe(s) without damage to nail, initial encounter: Secondary | ICD-10-CM | POA: Insufficient documentation

## 2014-09-20 DIAGNOSIS — K219 Gastro-esophageal reflux disease without esophagitis: Secondary | ICD-10-CM | POA: Insufficient documentation

## 2014-09-20 DIAGNOSIS — S92912B Unspecified fracture of left toe(s), initial encounter for open fracture: Secondary | ICD-10-CM

## 2014-09-20 DIAGNOSIS — Z8659 Personal history of other mental and behavioral disorders: Secondary | ICD-10-CM | POA: Diagnosis not present

## 2014-09-20 DIAGNOSIS — Z79899 Other long term (current) drug therapy: Secondary | ICD-10-CM | POA: Diagnosis not present

## 2014-09-20 DIAGNOSIS — I509 Heart failure, unspecified: Secondary | ICD-10-CM | POA: Diagnosis not present

## 2014-09-20 DIAGNOSIS — G4733 Obstructive sleep apnea (adult) (pediatric): Secondary | ICD-10-CM | POA: Insufficient documentation

## 2014-09-20 DIAGNOSIS — I4891 Unspecified atrial fibrillation: Secondary | ICD-10-CM | POA: Insufficient documentation

## 2014-09-20 DIAGNOSIS — Z7982 Long term (current) use of aspirin: Secondary | ICD-10-CM | POA: Insufficient documentation

## 2014-09-20 DIAGNOSIS — E669 Obesity, unspecified: Secondary | ICD-10-CM | POA: Insufficient documentation

## 2014-09-20 DIAGNOSIS — E119 Type 2 diabetes mellitus without complications: Secondary | ICD-10-CM | POA: Diagnosis not present

## 2014-09-20 DIAGNOSIS — Y998 Other external cause status: Secondary | ICD-10-CM | POA: Insufficient documentation

## 2014-09-20 DIAGNOSIS — S99922A Unspecified injury of left foot, initial encounter: Secondary | ICD-10-CM | POA: Diagnosis present

## 2014-09-20 DIAGNOSIS — Z8781 Personal history of (healed) traumatic fracture: Secondary | ICD-10-CM | POA: Diagnosis not present

## 2014-09-20 DIAGNOSIS — S91119A Laceration without foreign body of unspecified toe without damage to nail, initial encounter: Secondary | ICD-10-CM

## 2014-09-20 DIAGNOSIS — Y9389 Activity, other specified: Secondary | ICD-10-CM | POA: Diagnosis not present

## 2014-09-20 MED ORDER — CEPHALEXIN 500 MG PO CAPS: 500.0000 mg | ORAL_CAPSULE | Freq: Two times a day (BID) | ORAL | Status: AC

## 2014-09-20 MED ORDER — LIDOCAINE HCL 1 % IJ SOLN
5.0000 mL | Freq: Once | INTRAMUSCULAR | Status: AC
  Administered 2014-09-20: 5 mL
  Filled 2014-09-20: qty 20

## 2014-09-20 MED ORDER — SODIUM BICARBONATE 4 % IV SOLN
5.0000 mL | Freq: Once | INTRAVENOUS | Status: AC
  Administered 2014-09-20: 5 mL via SUBCUTANEOUS
  Filled 2014-09-20: qty 5

## 2014-09-20 NOTE — ED Notes (Signed)
Bed: [id number] Expected date: 09/20/14 Expected time:  Means of arrival:  Comments: EMS

## 2014-09-20 NOTE — Discharge Instructions (Signed)
If possible remove the stitches in 7-10 days. Watch for signs of infection. Follow-up with Ortho as needed if the nursing home doctors do not feel comfortable managing this on their own.  Laceration Care, Adult A laceration is a cut or lesion that goes through all layers of the skin and into the tissue just beneath the skin. TREATMENT  Some lacerations may not require closure. Some lacerations may not be able to be closed due to an increased risk of infection. It is important to see your caregiver as soon as possible after an injury to minimize the risk of infection and maximize the opportunity for successful closure. If closure is appropriate, pain medicines may be given, if needed. The wound will be cleaned to help prevent infection. Your caregiver will use stitches (sutures), staples, wound glue (adhesive), or skin adhesive strips to repair the laceration. These tools bring the skin edges together to allow for faster healing and a better cosmetic outcome. However, all wounds will heal with a scar. Once the wound has healed, scarring can be minimized by covering the wound with sunscreen during the day for 1 full year. HOME CARE INSTRUCTIONS  For sutures or staples:  Keep the wound clean and dry.  If you were given a bandage (dressing), you should change it at least once a day. Also, change the dressing if it becomes wet or dirty, or as directed by your caregiver.  Wash the wound with soap and water 2 times a day. Rinse the wound off with water to remove all soap. Pat the wound dry with a clean towel.  After cleaning, apply a thin layer of the antibiotic ointment as recommended by your caregiver. This will help prevent infection and keep the dressing from sticking.  You may shower as usual after the first 24 hours. Do not soak the wound in water until the sutures are removed.  Only take over-the-counter or prescription medicines for pain, discomfort, or fever as directed by your  caregiver.  Get your sutures or staples removed as directed by your caregiver. For skin adhesive strips:  Keep the wound clean and dry.  Do not get the skin adhesive strips wet. You may bathe carefully, using caution to keep the wound dry.  If the wound gets wet, pat it dry with a clean towel.  Skin adhesive strips will fall off on their own. You may trim the strips as the wound heals. Do not remove skin adhesive strips that are still stuck to the wound. They will fall off in time. For wound adhesive:  You may briefly wet your wound in the shower or bath. Do not soak or scrub the wound. Do not swim. Avoid periods of heavy perspiration until the skin adhesive has fallen off on its own. After showering or bathing, gently pat the wound dry with a clean towel.  Do not apply liquid medicine, cream medicine, or ointment medicine to your wound while the skin adhesive is in place. This may loosen the film before your wound is healed.  If a dressing is placed over the wound, be careful not to apply tape directly over the skin adhesive. This may cause the adhesive to be pulled off before the wound is healed.  Avoid prolonged exposure to sunlight or tanning lamps while the skin adhesive is in place. Exposure to ultraviolet light in the first year will darken the scar.  The skin adhesive will usually remain in place for 5 to 10 days, then naturally fall off the  skin. Do not pick at the adhesive film. You may need a tetanus shot if:  You cannot remember when you had your last tetanus shot.  You have never had a tetanus shot. If you get a tetanus shot, your arm may swell, get red, and feel warm to the touch. This is common and not a problem. If you need a tetanus shot and you choose not to have one, there is a rare chance of getting tetanus. Sickness from tetanus can be serious. SEEK MEDICAL CARE IF:   You have redness, swelling, or increasing pain in the wound.  You see a red line that goes away  from the wound.  You have yellowish-white fluid (pus) coming from the wound.  You have a fever.  You notice a bad smell coming from the wound or dressing.  Your wound breaks open before or after sutures have been removed.  You notice something coming out of the wound such as wood or glass.  Your wound is on your hand or foot and you cannot move a finger or toe. SEEK IMMEDIATE MEDICAL CARE IF:   Your pain is not controlled with prescribed medicine.  You have severe swelling around the wound causing pain and numbness or a change in color in your arm, hand, leg, or foot.  Your wound splits open and starts bleeding.  You have worsening numbness, weakness, or loss of function of any joint around or beyond the wound.  You develop painful lumps near the wound or on the skin anywhere on your body. MAKE SURE YOU:   Understand these instructions.  Will watch your condition.  Will get help right away if you are not doing well or get worse. Document Released: 09/25/2005 Document Revised: 12/18/2011 Document Reviewed: 03/21/2011 [facility] Patient Information 2015 [place], [place]. This information is not intended to replace advice given to you by your health care provider. Make sure you discuss any questions you have with your health care provider.  Toe Fracture Your caregiver has diagnosed you as having a fractured toe. A toe fracture is a break in the bone of a toe. "Buddy taping" is a way of splinting your broken toe, by taping the broken toe to the toe next to it. This "buddy taping" will keep the injured toe from moving beyond normal range of motion. Buddy taping also helps the toe heal in a more normal alignment. It may take 6 to 8 weeks for the toe injury to heal. HOME CARE INSTRUCTIONS   Leave your toes taped together for as long as directed by your caregiver or until you see a doctor for a follow-up examination. You can change the tape after bathing. Always use a small piece of gauze  or cotton between the toes when taping them together. This will help the skin stay dry and prevent infection.  Apply ice to the injury for 15-20 minutes each hour while awake for the first 2 days. Put the ice in a plastic bag and place a towel between the bag of ice and your skin.  After the first 2 days, apply heat to the injured area. Use heat for the next 2 to 3 days. Place a heating pad on the foot or soak the foot in warm water as directed by your caregiver.  Keep your foot elevated as much as possible to lessen swelling.  Wear sturdy, supportive shoes. The shoes should not pinch the toes or fit tightly against the toes.  Your caregiver may prescribe a rigid  shoe if your foot is very swollen.  Your may be given crutches if the pain is too great and it hurts too much to walk.  Only take over-the-counter or prescription medicines for pain, discomfort, or fever as directed by your caregiver.  If your caregiver has given you a follow-up appointment, it is very important to keep that appointment. Not keeping the appointment could result in a chronic or permanent injury, pain, and disability. If there is any problem keeping the appointment, you must call back to this facility for assistance. SEEK MEDICAL CARE IF:   You have increased pain or swelling, not relieved with medications.  The pain does not get better after 1 week.  Your injured toe is cold when the others are warm. SEEK IMMEDIATE MEDICAL CARE IF:   The toe becomes cold, numb, or white.  The toe becomes hot (inflamed) and red. Document Released: 09/22/2000 Document Revised: 12/18/2011 Document Reviewed: 05/11/2008 [facility] Patient Information 2015 [place], [place]. This information is not intended to replace advice given to you by your health care provider. Make sure you discuss any questions you have with your health care provider.

## 2014-09-20 NOTE — ED Notes (Addendum)
[facility]Per EMS, pt was being transferred to wheelchair, hit his L fifth toe and received a laceration. Pt has hx of MR. Pt in NAD. Pt is DNR

## 2014-09-20 NOTE — ED Notes (Signed)
Pt family member at bedside.

## 2014-09-20 NOTE — ED Notes (Signed)
[facility] called to transport pt back to [facility] 

## 2014-09-20 NOTE — ED Provider Notes (Signed)
CSN: [id number]     Arrival date & time 09/20/14  0740 History   First MD Initiated Contact with Patient 09/20/14 0745     Chief Complaint  Patient presents with  . Toe Injury     (Consider location/radiation/quality/duration/timing/severity/associated sxs/prior Treatment) The history is provided by the patient and the EMS personnel.   patient from nursing home. Reportedly caught left toe while transferring to wheelchair. There is a laceration. Patient is unable to give me much history.  Past Medical History  Diagnosis Date  . Unspecified intellectual disabilities   . Excitative type psychosis   . CHF (congestive heart failure)   . Chronic respiratory failure   . Atrial fibrillation   . Obstructive sleep apnea (adult) (pediatric)   . Diabetes mellitus without complication   . Edema   . Hyperlipidemia   . Hypertension   . Hypopotassemia   . Seizures   . GERD (gastroesophageal reflux disease)   . Unspecified constipation   . Obesity   . Femoral neck fracture    History reviewed. No pertinent past surgical history. Family History  Problem Relation Age of Onset  . Cancer Mother   . Cancer Father     throat cancer   History  Substance Use Topics  . Smoking status: Passive Smoke Exposure - Never Smoker  . Smokeless tobacco: Not on file  . Alcohol Use: No    Review of Systems  Unable to perform ROS     Allergies  Review of patient's allergies indicates no known allergies.  Home Medications   Prior to Admission medications   Medication Sig Start Date End Date Taking? Authorizing Provider  aspirin 81 MG chewable tablet Take 1 tablet by mouth daily for A-Fib  * use house stock*   Yes Historical Provider, MD  carbamazepine (TEGRETOL) 200 MG tablet Take 300 mg by mouth 3 (three) times daily. 1 and 1/2 tablets =300mg    Yes Historical Provider, MD  diltiazem (CARDIZEM) 120 MG tablet Take 120 mg by mouth daily. for HTN   Yes Historical Provider, MD  insulin aspart  (NOVOLOG) 100 UNIT/ML injection Inject into the skin. Check BS before meals if 100-150=5 units, if 151 and greater = 10 units  * No Bedtime Dose* for DM *Do Not mix with other insulins*  Expires 28 days after opening.  *check expiration date*   Yes Historical Provider, MD  lactulose (CHRONULAC) 10 GM/15ML solution Take 45 mLs (30 g total) by mouth 2 (two) times daily. 09/18/14  Yes Russella DarAllison L Ellis, NP  levofloxacin (LEVAQUIN) 500 MG tablet Take 1 tablet (500 mg total) by mouth daily. 09/19/14 09/21/14 Yes Lonia BloodJeffrey T McClung, MD  metoprolol tartrate (LOPRESSOR) 25 MG tablet Take 75 mg by mouth 2 (two) times daily. **Hold for pulse </=50 and SBP</= 100**   Yes Historical Provider, MD  primidone (MYSOLINE) 50 MG tablet Take 200 mg by mouth 3 (three) times daily.    Yes Historical Provider, MD  ranitidine (ZANTAC) 75 MG tablet Take 75 mg by mouth at bedtime. For GERD   Yes Historical Provider, MD  sennosides-docusate sodium (SENOKOT-S) 8.6-50 MG tablet Take 1 tabley by mouth every night at bedtime for constipation  * Use House Stock*   Yes Historical Provider, MD  traMADol (ULTRAM) 50 MG tablet Take 1 tablet by mouth every 4 hours as needed for pain 04/22/14  Yes Kimber RelicArthur G Green, MD  cephALEXin (KEFLEX) 500 MG capsule Take 1 capsule (500 mg total) by mouth 2 (two) times daily.  09/20/14   Juliet RudeNathan R. Neysha Criado, MD  Vitamin D, Ergocalciferol, (DRISDOL) 50000 UNITS CAPS capsule Take 50,000 Units by mouth every 7 (seven) days. MONDAY    Historical Provider, MD   BP 134/47 mmHg  Pulse 89  Temp(Src) 98.8 F (37.1 C) (Oral)  Resp 16  SpO2 92% Physical Exam  Constitutional: He appears well-developed and well-nourished.  Cardiovascular: Normal rate.   Pulmonary/Chest: Effort normal.  Musculoskeletal:  Laceration to plantar aspect of left fifth toe. Toe grossly appears stable. No tenderness over foot otherwise.  Skin: Skin is warm.    ED Course  Procedures (including critical care time) Labs Review Labs  Reviewed - No data to display  Imaging Review Dg Toe 5th Left  09/20/2014   CLINICAL DATA:  Laceration on plantar aspect of 5th digit of left foot; pt in sleep, not able to answer to question; per EMT's note pt got laceration on his left 5th toe while he was being transferred to a wheelchair today;  EXAM: DG TOE 5TH LEFT  COMPARISON:  None.  FINDINGS: 2 mm corner fracture at the lateral base of the middle phalanx, minimally displaced. No other acute bone abnormality. Normal mineralization and alignment. Regional soft tissues unremarkable. No radiodense foreign body.  IMPRESSION: 1. Minimally displaced fracture, lateral base middle phalanx left little toe.   Electronically Signed   By: Oley Balmaniel  Hassell M.D.   On: 09/20/2014 08:40     EKG Interpretation None     LACERATION REPAIR Performed by: Billee CashingPICKERING,Zalika Tieszen R. Authorized by: Billee CashingPICKERING,Keiley Levey R. Consent: Verbal consent obtained. Risks and benefits: risks, benefits and alternatives were discussed Consent given by: patient Patient identity confirmed: provided demographic data Prepped and Draped in normal sterile fashion Wound explored  Laceration Location: left 5th toe  Laceration Length: 1.5cm  No Foreign Bodies seen or palpated  Anesthesia: local infiltration  Local anesthetic: lidocaine 1% with neut  Anesthetic total: 10 ml including irrigation   Irrigation method: saline bottle Amount of cleaning: standard  Skin closure: 4-0 Vicryl rapede  Number of sutures: *2  Technique: simple interruped  Patient tolerance: Patient tolerated the procedure well with no immediate complications.  MDM   Final diagnoses:  Laceration of toe of left foot  Open fracture of toe of left foot, initial encounter    Patient with open fracture and laceration of left little toe. Will give prophylactic antibiotics. It is right in the crease of the toe and approximates very well. 2 sutures placed. X-ray showed small fracture. Will follow-up  with PCP or orthopedic surgery. Sutures are absorbable but may benefit from being removed. Toe was also buddy taped and given a postop shoe.    Juliet RudeNathan R. Rubin PayorPickering, MD 09/20/14 [phone]

## 2014-09-21 LAB — CARBAMAZEPINE, FREE AND TOTAL
CARBAMAZEPINE, BOUND: 4.1
CARBAMAZEPINE, TOTAL: 5
Carbamazepine Metabolite/: 1.4

## 2014-09-22 ENCOUNTER — Non-Acute Institutional Stay (SKILLED_NURSING_FACILITY): Payer: Medicare Other | Admitting: Adult Health

## 2014-09-22 ENCOUNTER — Encounter: Payer: Self-pay | Admitting: Adult Health

## 2014-09-22 DIAGNOSIS — I482 Chronic atrial fibrillation, unspecified: Secondary | ICD-10-CM

## 2014-09-22 DIAGNOSIS — I509 Heart failure, unspecified: Secondary | ICD-10-CM

## 2014-09-22 DIAGNOSIS — I10 Essential (primary) hypertension: Secondary | ICD-10-CM

## 2014-09-22 DIAGNOSIS — R7989 Other specified abnormal findings of blood chemistry: Secondary | ICD-10-CM

## 2014-09-22 DIAGNOSIS — G4733 Obstructive sleep apnea (adult) (pediatric): Secondary | ICD-10-CM

## 2014-09-22 DIAGNOSIS — J189 Pneumonia, unspecified organism: Secondary | ICD-10-CM

## 2014-09-22 DIAGNOSIS — F79 Unspecified intellectual disabilities: Secondary | ICD-10-CM

## 2014-09-22 DIAGNOSIS — E118 Type 2 diabetes mellitus with unspecified complications: Secondary | ICD-10-CM

## 2014-09-22 DIAGNOSIS — K219 Gastro-esophageal reflux disease without esophagitis: Secondary | ICD-10-CM

## 2014-09-22 DIAGNOSIS — G40909 Epilepsy, unspecified, not intractable, without status epilepticus: Secondary | ICD-10-CM

## 2014-09-22 DIAGNOSIS — Z9989 Dependence on other enabling machines and devices: Secondary | ICD-10-CM

## 2014-09-22 NOTE — Progress Notes (Signed)
Patient ID: Tony CoriaMark Majchrzak, male   DOB: Aug 28, 1956, 58 y.o.   MRN: [id number]   09/22/2014  Facility:  Nursing Home [facility]  [facility] Room Number: 802-2 LEVEL OF CARE:  SNF (31)   Chief Complaint  Patient presents with  . Hospitalization Follow-up    HCAP, hyperammonemia, diabetes mellitus, obstructive sleep apnea, atrial fibrillation, GERD, hypertension, intellectual disability, seizure and CHF    HISTORY OF PRESENT ILLNESS:  This is a 58 year old male who has [facility] with hypercapnic respiratory failure. He has been noncompliant with his CPAP, has healthcare associated pneumonia and elevated ammonia level. He had a recent fall and sustained laceration and fracture of left fifth toe. It was sutured in the ED and returned to the facility.  REASSESSMENT OF ONGOING PROBLEMS:  ATRIAL FIBRILLATION: the patients atrial fibrillation remains stable.  The patient denies tachycardia, orthopnea, palpitations.    DM:pt's DM remains stable.  Pt denies polyuria, polydipsia, polyphagia, changes in vision or hypoglycemic episodes.  No complications noted from the medication presently being used.  Last hemoglobin A1c is: 6.0  GERD: pt's GERD is stable.  Denies ongoing heartburn, abd. Pain, nausea or vomiting.  Currently on a PPI & tolerates it without any adverse reactions.  PAST MEDICAL HISTORY:  Past Medical History  Diagnosis Date  . Unspecified intellectual disabilities   . Excitative type psychosis   . CHF (congestive heart failure)   . Chronic respiratory failure   . Atrial fibrillation   . Obstructive sleep apnea (adult) (pediatric)   . Diabetes mellitus without complication   . Edema   . Hyperlipidemia   . Hypertension   . Hypopotassemia   . Seizures   . GERD (gastroesophageal reflux disease)   . Unspecified constipation   . Obesity   . Femoral neck fracture     CURRENT MEDICATIONS:  Reviewed per MAR/see medication list  No Known Allergies   REVIEW OF SYSTEMS:  GENERAL: no change in appetite, no fatigue, no weight changes, no fever, chills or weakness RESPIRATORY: no cough, DOE, wheezing, hemoptysis CARDIAC: no chest pain, or palpitations GI: no abdominal pain, diarrhea, constipation, heart burn, nausea or vomiting  PHYSICAL EXAMINATION  GENERAL: no acute distress, normal body habitus EYES: conjunctivae normal, sclerae normal, normal eye lids NECK: supple, trachea midline, no neck masses, no thyroid tenderness, no thyromegaly LYMPHATICS: no LAN in the neck, no supraclavicular LAN RESPIRATORY: breathing is even & unlabored, BS CTAB CARDIAC: RRR, no murmur,no extra heart sounds, no edema GI: abdomen soft, normal BS, no masses, no tenderness, no hepatomegaly, no splenomegaly EXTREMITIES: able to move all 4 extremities; uses wheelchair PSYCHIATRIC: the patient is alert & oriented to person, affect & behavior appropriate  LABS/RADIOLOGY: Labs reviewed: Basic Metabolic Panel:  Recent Labs  40/98/1111/06/24 0205 09/17/14 0300 09/18/14 0344  NA 132* 138 138  K 4.8 4.6 4.4  CL 95* 97 98  CO2 30 32 30  GLUCOSE 132* 112* 141*  BUN 11 10 9   CREATININE 0.41* 0.47* 0.38*  CALCIUM 8.9 8.9 8.9   Liver Function Tests:  Recent Labs  09/16/14 0205 09/17/14 0300 09/18/14 0344  AST 66* 81* 73*  ALT 53 72* 73*  ALKPHOS 70 72 76  BILITOT 0.5 0.5 0.5  PROT 7.6 8.0 7.6  ALBUMIN 2.8* 3.0* 2.9*    Recent Labs  09/16/14 0205 09/17/14 0300 09/18/14 0344  AMMONIA 120* 96* 91*   CBC:  Recent Labs  09/16/14 0205 09/17/14 0300 09/18/14 0344  WBC 5.2 5.5 5.5  HGB 14.2 15.0 14.1  HCT 44.1 46.6 43.9  MCV 98.2 99.1 98.4  PLT 116* 106* 109*   Lipid Panel:  Recent Labs  03/03/14  HDL 41   CBG:  Recent Labs  09/17/14 2108 09/18/14 0748 09/18/14 1101  GLUCAP 163* 111* 135*    Ct Head Wo Contrast  09/12/2014   CLINICAL DATA:  Shortness of breath and  increased C02 levels. History of developmental disorder.  EXAM: CT HEAD WITHOUT CONTRAST  TECHNIQUE: Contiguous axial images were obtained from the base of the skull through the vertex without intravenous contrast.  COMPARISON:  Head CT 06/17/2013.  FINDINGS: There is no evidence of acute intracranial hemorrhage, mass lesion, brain edema or extra-axial fluid collection. The ventricles and subarachnoid spaces are appropriately sized for age. There is no CT evidence of acute cortical infarction.  The visualized paranasal sinuses, mastoid air cells and middle ears are clear. Calvarial hyperostosis noted. No acute osseous findings demonstrated.  IMPRESSION: Stable noncontrast head CT.  No acute intracranial findings.   Electronically Signed   By: Roxy HorsemanBill  Veazey M.D.   On: 09/12/2014 12:10   Mr Brain Wo Contrast  09/13/2014   CLINICAL DATA:  New onset Altered level of consciousness for this 58 year old male with mental retardation, atrial fibrillation, diabetes, and seizures. Hypercapnic respiratory failure. Initial encounter.  EXAM: MRI HEAD WITHOUT CONTRAST  TECHNIQUE: Multiplanar, multiecho pulse sequences of the brain and surrounding structures were obtained without intravenous contrast.  COMPARISON:  CT head 09/12/2014.  FINDINGS: The patient was unable to remain motionless for the exam. Small or subtle lesions could be overlooked.  No evidence for acute infarction, hemorrhage, mass lesion, hydrocephalus, or extra-axial fluid. Mild atrophy. Minor white matter signal abnormality, nonspecific. Flow voids are maintained throughout the carotid, basilar, and vertebral arteries. No midline abnormality. Small microbleed LEFT posterior temporal subcortical white matter likely chronic, of doubtful significance. Flow voids are maintained. Extracranial soft tissues grossly unremarkable.  IMPRESSION: Motion degraded exam.  No acute intracranial abnormality.  Mild atrophy.   Electronically Signed   By: Davonna BellingJohn  Curnes M.D.   On:  09/13/2014 13:16   Dg Chest Port 1 View  09/13/2014   CLINICAL DATA:  Hypoxia  EXAM: PORTABLE CHEST - 1 VIEW  COMPARISON:  09/11/2014  FINDINGS: There is patchy lingular and left lower lobe airspace disease. There is no pleural effusion or pneumothorax. Right lung is clear. There is stable cardiomegaly there is prominence of the pulmonary vasculature. There is no acute osseous abnormality.  IMPRESSION: Patchy lingular and left lower lobe airspace disease which may reflect multifocal atelectasis versus pneumonia.   Electronically Signed   By: Elige KoHetal  Patel   On: 09/13/2014 17:42   Dg Chest Portable 1 View  09/11/2014   CLINICAL DATA:  58 year old male with elevated CO2 levels today, presenting with generalized malaise and tremors.  EXAM: PORTABLE CHEST - 1 VIEW  COMPARISON:  Chest x-ray 06/13/2005.  FINDINGS: Extensive airspace consolidation in the left lower lobe and lingula, concerning for multilobar pneumonia. Cephalization of the pulmonary vasculature with slight indistinctness of the interstitial markings. Cardiomegaly. Upper mediastinal contours are distorted by patient's rotation to the right, but there is left hilar prominence. Possible small left pleural effusion.  IMPRESSION: 1. The appearance the chest suggests multilobar pneumonia, most severe in the left lower lobe and lingula. Repeat standing PA and lateral chest radiographs in 2-3 weeks after trial of antimicrobial therapy is strongly recommended  to ensure resolution of these findings (i.e., to exclude a centrally obstructing lesion). 2. In addition, there is evidence of at least pulmonary venous congestion, if not mild interstitial pulmonary edema.   Electronically Signed   By: Trudie Reed M.D.   On: 09/11/2014 15:39   Dg Toe 5th Left  09/20/2014   CLINICAL DATA:  Laceration on plantar aspect of 5th digit of left foot; pt in sleep, not able to answer to question; per EMT's note pt got laceration on his left 5th toe while he was being  transferred to a wheelchair today;  EXAM: DG TOE 5TH LEFT  COMPARISON:  None.  FINDINGS: 2 mm corner fracture at the lateral base of the middle phalanx, minimally displaced. No other acute bone abnormality. Normal mineralization and alignment. Regional soft tissues unremarkable. No radiodense foreign body.  IMPRESSION: 1. Minimally displaced fracture, lateral base middle phalanx left little toe.   Electronically Signed   By: Oley Balm M.D.   On: 09/20/2014 08:40    ASSESSMENT/PLAN:  Healthcare associated pneumonia - continue Levaquin 500 mg by mouth daily Diabetes mellitus, type II - controlled; hemoglobin A1c 6.0; Lantus and Actos were recently discontinued; continue NovoLog sliding scale with CBG before meals  Obstructive sleep apnea - continue CPAP Atrial fibrillation - rate controlled; continue aspirin 81 mg by mouth daily, Cardizem and Lopressor Intellectual disability - continue supportive care Seizure - continue carbamazepine 300 mg by mouth 3 times a day and Primidone 200 mg by mouth 3 times a day Hyperammonemia - continue lactulose 10 g/15 mL give 45 mL by mouth twice a day; ammonia level weekly 4 CHF - compensated Hypertension - well controlled; continue Cardizem to 20 mg by mouth daily and Lopressor 25 mg take 3 tabs = 75 mg by mouth twice a day GERD - stable; continue Zantac 75 mg by mouth every at bedtime Fracture of left fifth toe - continue buddy splint and hard sole shoe   Spent 50 minutes in patient care.     [facility], NP [employer] [phone]

## 2014-09-23 ENCOUNTER — Non-Acute Institutional Stay (SKILLED_NURSING_FACILITY): Payer: Medicare Other | Admitting: Internal Medicine

## 2014-09-23 DIAGNOSIS — I482 Chronic atrial fibrillation, unspecified: Secondary | ICD-10-CM

## 2014-09-23 DIAGNOSIS — R7989 Other specified abnormal findings of blood chemistry: Secondary | ICD-10-CM

## 2014-09-23 DIAGNOSIS — E119 Type 2 diabetes mellitus without complications: Secondary | ICD-10-CM

## 2014-09-23 DIAGNOSIS — J9622 Acute and chronic respiratory failure with hypercapnia: Secondary | ICD-10-CM

## 2014-09-23 DIAGNOSIS — R1314 Dysphagia, pharyngoesophageal phase: Secondary | ICD-10-CM

## 2014-09-23 DIAGNOSIS — R740 Nonspecific elevation of levels of transaminase and lactic acid dehydrogenase [LDH]: Secondary | ICD-10-CM

## 2014-09-23 DIAGNOSIS — J189 Pneumonia, unspecified organism: Secondary | ICD-10-CM

## 2014-09-23 DIAGNOSIS — G40909 Epilepsy, unspecified, not intractable, without status epilepticus: Secondary | ICD-10-CM

## 2014-09-23 DIAGNOSIS — R7401 Elevation of levels of liver transaminase levels: Secondary | ICD-10-CM

## 2014-09-23 DIAGNOSIS — K219 Gastro-esophageal reflux disease without esophagitis: Secondary | ICD-10-CM

## 2014-09-23 LAB — AMINO ACIDS, PLASMA

## 2014-09-23 LAB — ORGANIC ACIDS, URINE

## 2014-09-23 NOTE — Progress Notes (Signed)
Patient ID: Tony CoriaMark Terry, male   DOB: [birth year], 58 y.o.   MRN: [id number]     [facility]  Code Status: DNR  No Known Allergies  Chief Complaint  Patient presents with  . New Admit To SNF     HPI:  58 y/o male pt is here for long term care post hospital admission from 09/11/14-09/18/14 with altered level of consciousness and shortness of breath. He was noted to have hypercapnic respiratory failure. chest x-ray showed multilobar pneumonia. He had improvement after treatment with BiPAP.Pulmonary Medicine was consulted. 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 neurology was consulted and recommended continuing current regimen and monitor the patient. He was also given lactulose with elevated ammonia level.  He has pmh of development disability, congestive heart failure, chronic respiratory failure, atrial fibrillation, sleep apnea, diabetes and seizure disorder. He is seen in his room sitting on his wheelchair. He minimally participates in conversation. Unable to obtain HPI and ROS  ROS:  Unable to obtain  Past Medical History  Diagnosis Date  . Unspecified intellectual disabilities   . Excitative type psychosis   . CHF (congestive heart failure)   . Chronic respiratory failure   . Atrial fibrillation   . Obstructive sleep apnea (adult) (pediatric)   . Diabetes mellitus without complication   . Edema   . Hyperlipidemia   . Hypertension   . Hypopotassemia   . Seizures   . GERD (gastroesophageal reflux disease)   . Unspecified constipation   . Obesity   . Femoral neck fracture    No past surgical history on file. Social History:   reports that he has been passively smoking.  He does not have any smokeless tobacco history on file. He reports that he does not drink alcohol or use illicit  drugs.  Family History  Problem Relation Age of Onset  . Cancer Mother   . Cancer Father     throat cancer    Medications: Patient's Medications  New Prescriptions   No medications on file  Previous Medications   ASPIRIN 81 MG CHEWABLE TABLET    Take 1 tablet by mouth daily for A-Fib  * use house stock*   CARBAMAZEPINE (TEGRETOL) 200 MG TABLET    Take 300 mg by mouth 3 (three) times daily. 1 and 1/2 tablets =300mg    CEPHALEXIN (KEFLEX) 500 MG CAPSULE    Take 1 capsule (500 mg total) by mouth 2 (two) times daily.   DILTIAZEM (CARDIZEM) 120 MG TABLET    Take 120 mg by mouth daily. for HTN   INSULIN ASPART (NOVOLOG) 100 UNIT/ML INJECTION    Inject into the skin. Check BS before meals if 100-150=5 units, if 151 and greater = 10 units  * No Bedtime Dose* for DM *Do Not mix with other insulins*  Expires 28 days after opening.  *check expiration date*   LACTULOSE (CHRONULAC) 10 GM/15ML SOLUTION    Take 45 mLs (30 g total) by mouth 2 (two) times daily.   METOPROLOL TARTRATE (LOPRESSOR) 25 MG TABLET    Take 75 mg by mouth 2 (two) times daily. **Hold for pulse </=50 and SBP</= 100**   PRIMIDONE (MYSOLINE) 50 MG TABLET    Take 200 mg by mouth 3 (three) times daily.    RANITIDINE (ZANTAC) 75 MG TABLET    Take 75 mg by mouth at bedtime. For  GERD   SENNOSIDES-DOCUSATE SODIUM (SENOKOT-S) 8.6-50 MG TABLET    Take 1 tabley by mouth every night at bedtime for constipation  * Use House Stock*   TRAMADOL (ULTRAM) 50 MG TABLET    Take 1 tablet by mouth every 4 hours as needed for pain   VITAMIN D, ERGOCALCIFEROL, (DRISDOL) 50000 UNITS CAPS CAPSULE    Take 50,000 Units by mouth every 7 (seven) days. MONDAY  Modified Medications   No medications on file  Discontinued Medications   No medications on file     Physical Exam: Filed Vitals:   09/23/14 1019  BP: 109/65  Pulse: 65  Temp: 99.5 F (37.5 C)  Resp: 18  SpO2: 93%    General- elderly obese male in no acute distress Head- atraumatic,  normocephalic Eyes- no pallor, no icterus Neck- no cervical lymphadenopathy Throat- moist mucus membrane Cardiovascular- normal s1,s2, no murmurs Respiratory- bilateral poor air entry with rhonchi at bases, no wheeze or crackles, on o2  Abdomen- bowel sounds present, soft, non tender Musculoskeletal- able to move all 4 extremities, no leg edema Neurological- has developmental disability, tremor noted in his hands, alert Skin- warm and dry  Labs reviewed: Basic Metabolic Panel:  Recent Labs  09/81/1911/06/24 0205 09/17/14 0300 09/18/14 0344  NA 132* 138 138  K 4.8 4.6 4.4  CL 95* 97 98  CO2 30 32 30  GLUCOSE 132* 112* 141*  BUN 11 10 9   CREATININE 0.41* 0.47* 0.38*  CALCIUM 8.9 8.9 8.9   Liver Function Tests:  Recent Labs  09/16/14 0205 09/17/14 0300 09/18/14 0344  AST 66* 81* 73*  ALT 53 72* 73*  ALKPHOS 70 72 76  BILITOT 0.5 0.5 0.5  PROT 7.6 8.0 7.6  ALBUMIN 2.8* 3.0* 2.9*   No results for input(s): LIPASE, AMYLASE in the last 8760 hours.  Recent Labs  09/16/14 0205 09/17/14 0300 09/18/14 0344  AMMONIA 120* 96* 91*   CBC:  Recent Labs  09/16/14 0205 09/17/14 0300 09/18/14 0344  WBC 5.2 5.5 5.5  HGB 14.2 15.0 14.1  HCT 44.1 46.6 43.9  MCV 98.2 99.1 98.4  PLT 116* 106* 109*   Cardiac Enzymes: No results for input(s): CKTOTAL, CKMB, CKMBINDEX, TROPONINI in the last 8760 hours. BNP: Invalid input(s): POCBNP CBG:  Recent Labs  09/17/14 2108 09/18/14 0748 09/18/14 1101  GLUCAP 163* 111* 135*    Assessment/Plan  Acute on chronic respiratory failure with hypercapnia On o2, continue CPAP at night. Continue to monitor for signs of hypercarbia  Healthcare Associated Pneumonia Complete course of levaquin on 09/28/14. Strict aspiration precautions. Will have SLP follow with pt sounding congested  Dysphagia With him coughing with drinks, will change his liquid to nectar thick. Reviewed with SLP team. Pt to undergo FEES next week. Strict aspiration  precautions and assistance with feed.  seizure disorder Continue Tegretol and primidone. Monitor clinically  chronic atrial fibrillation Continue cardizem 120 mg daily for rate control with lopressor 75 mg bid. Continue aspirin  Hyperammoniemia Continue lactulose and pending weekly ammonia level check.     Mild transaminitis Trend LFTs  gerd Stable on zantac 75 mg qhs for now  DM Lab Results  Component Value Date   HGBA1C 6.0* 09/11/2014   D/c SSI novolog, to be given 5 u for cbg > 200 only at present  Labs- cbc with diff, cmp next lab  Family/ staff Communication: reviewed care plan with patient and nursing supervisor   Goals of care: short term rehabilitation   [facility]  [place], [facility] [phone] (Monday-Friday 8 am - 5 pm) [phone] (afterhours)

## 2014-09-25 LAB — PRIMIDONE AND METABOLITE LEVEL
Phenobarbital: 9.9 mg/L — ABNORMAL LOW (ref 15.0–40.0)
Primidone, Serum: 3.1 ug/mL (ref 8–12)

## 2014-09-27 ENCOUNTER — Inpatient Hospital Stay (HOSPITAL_COMMUNITY)
Admission: EM | Admit: 2014-09-27 | Discharge: 2014-10-09 | DRG: 871 | Disposition: E | Payer: Medicare Other | Attending: Internal Medicine | Admitting: Internal Medicine

## 2014-09-27 ENCOUNTER — Encounter (HOSPITAL_COMMUNITY): Payer: Self-pay | Admitting: Emergency Medicine

## 2014-09-27 ENCOUNTER — Emergency Department (HOSPITAL_COMMUNITY): Payer: Medicare Other

## 2014-09-27 DIAGNOSIS — I509 Heart failure, unspecified: Secondary | ICD-10-CM

## 2014-09-27 DIAGNOSIS — E785 Hyperlipidemia, unspecified: Secondary | ICD-10-CM | POA: Diagnosis present

## 2014-09-27 DIAGNOSIS — E1165 Type 2 diabetes mellitus with hyperglycemia: Secondary | ICD-10-CM | POA: Diagnosis present

## 2014-09-27 DIAGNOSIS — Y95 Nosocomial condition: Secondary | ICD-10-CM | POA: Diagnosis present

## 2014-09-27 DIAGNOSIS — I5033 Acute on chronic diastolic (congestive) heart failure: Secondary | ICD-10-CM | POA: Diagnosis not present

## 2014-09-27 DIAGNOSIS — I10 Essential (primary) hypertension: Secondary | ICD-10-CM | POA: Diagnosis present

## 2014-09-27 DIAGNOSIS — G40909 Epilepsy, unspecified, not intractable, without status epilepticus: Secondary | ICD-10-CM

## 2014-09-27 DIAGNOSIS — K219 Gastro-esophageal reflux disease without esophagitis: Secondary | ICD-10-CM | POA: Diagnosis present

## 2014-09-27 DIAGNOSIS — G4733 Obstructive sleep apnea (adult) (pediatric): Secondary | ICD-10-CM | POA: Diagnosis present

## 2014-09-27 DIAGNOSIS — D696 Thrombocytopenia, unspecified: Secondary | ICD-10-CM | POA: Diagnosis present

## 2014-09-27 DIAGNOSIS — J9611 Chronic respiratory failure with hypoxia: Secondary | ICD-10-CM | POA: Diagnosis present

## 2014-09-27 DIAGNOSIS — I4891 Unspecified atrial fibrillation: Secondary | ICD-10-CM | POA: Diagnosis present

## 2014-09-27 DIAGNOSIS — N390 Urinary tract infection, site not specified: Secondary | ICD-10-CM | POA: Diagnosis present

## 2014-09-27 DIAGNOSIS — R7989 Other specified abnormal findings of blood chemistry: Secondary | ICD-10-CM | POA: Diagnosis present

## 2014-09-27 DIAGNOSIS — R001 Bradycardia, unspecified: Secondary | ICD-10-CM | POA: Diagnosis not present

## 2014-09-27 DIAGNOSIS — J189 Pneumonia, unspecified organism: Secondary | ICD-10-CM | POA: Diagnosis present

## 2014-09-27 DIAGNOSIS — Z66 Do not resuscitate: Secondary | ICD-10-CM | POA: Diagnosis present

## 2014-09-27 DIAGNOSIS — E7220 Disorder of urea cycle metabolism, unspecified: Secondary | ICD-10-CM | POA: Diagnosis present

## 2014-09-27 DIAGNOSIS — B9620 Unspecified Escherichia coli [E. coli] as the cause of diseases classified elsewhere: Secondary | ICD-10-CM | POA: Diagnosis present

## 2014-09-27 DIAGNOSIS — Z4659 Encounter for fitting and adjustment of other gastrointestinal appliance and device: Secondary | ICD-10-CM

## 2014-09-27 DIAGNOSIS — R569 Unspecified convulsions: Secondary | ICD-10-CM

## 2014-09-27 DIAGNOSIS — Z9989 Dependence on other enabling machines and devices: Secondary | ICD-10-CM

## 2014-09-27 DIAGNOSIS — F79 Unspecified intellectual disabilities: Secondary | ICD-10-CM

## 2014-09-27 DIAGNOSIS — R4182 Altered mental status, unspecified: Secondary | ICD-10-CM | POA: Diagnosis present

## 2014-09-27 DIAGNOSIS — D649 Anemia, unspecified: Secondary | ICD-10-CM | POA: Diagnosis present

## 2014-09-27 DIAGNOSIS — R6520 Severe sepsis without septic shock: Secondary | ICD-10-CM | POA: Diagnosis present

## 2014-09-27 DIAGNOSIS — R740 Nonspecific elevation of levels of transaminase and lactic acid dehydrogenase [LDH]: Secondary | ICD-10-CM | POA: Diagnosis present

## 2014-09-27 DIAGNOSIS — F73 Profound intellectual disabilities: Secondary | ICD-10-CM | POA: Diagnosis present

## 2014-09-27 DIAGNOSIS — A419 Sepsis, unspecified organism: Secondary | ICD-10-CM | POA: Diagnosis present

## 2014-09-27 DIAGNOSIS — Z7722 Contact with and (suspected) exposure to environmental tobacco smoke (acute) (chronic): Secondary | ICD-10-CM | POA: Diagnosis present

## 2014-09-27 DIAGNOSIS — Z7982 Long term (current) use of aspirin: Secondary | ICD-10-CM | POA: Diagnosis not present

## 2014-09-27 DIAGNOSIS — G9349 Other encephalopathy: Secondary | ICD-10-CM | POA: Diagnosis present

## 2014-09-27 DIAGNOSIS — Z6834 Body mass index (BMI) 34.0-34.9, adult: Secondary | ICD-10-CM

## 2014-09-27 DIAGNOSIS — E669 Obesity, unspecified: Secondary | ICD-10-CM | POA: Diagnosis present

## 2014-09-27 DIAGNOSIS — I482 Chronic atrial fibrillation: Secondary | ICD-10-CM | POA: Diagnosis present

## 2014-09-27 DIAGNOSIS — K7460 Unspecified cirrhosis of liver: Secondary | ICD-10-CM | POA: Diagnosis present

## 2014-09-27 DIAGNOSIS — E872 Acidosis: Secondary | ICD-10-CM | POA: Diagnosis present

## 2014-09-27 DIAGNOSIS — I469 Cardiac arrest, cause unspecified: Secondary | ICD-10-CM | POA: Diagnosis not present

## 2014-09-27 DIAGNOSIS — E119 Type 2 diabetes mellitus without complications: Secondary | ICD-10-CM

## 2014-09-27 DIAGNOSIS — J9610 Chronic respiratory failure, unspecified whether with hypoxia or hypercapnia: Secondary | ICD-10-CM | POA: Diagnosis present

## 2014-09-27 DIAGNOSIS — R7401 Elevation of levels of liver transaminase levels: Secondary | ICD-10-CM | POA: Diagnosis present

## 2014-09-27 LAB — CBC WITH DIFFERENTIAL/PLATELET
Basophils Absolute: 0 10*3/uL (ref 0.0–0.1)
Basophils Relative: 0 % (ref 0–1)
Eosinophils Absolute: 0.1 10*3/uL (ref 0.0–0.7)
Eosinophils Relative: 1 % (ref 0–5)
HCT: 44.7 % (ref 39.0–52.0)
HEMOGLOBIN: 14.1 g/dL (ref 13.0–17.0)
Lymphocytes Relative: 6 % — ABNORMAL LOW (ref 12–46)
Lymphs Abs: 0.7 10*3/uL (ref 0.7–4.0)
MCH: 31.6 pg (ref 26.0–34.0)
MCHC: 31.5 g/dL (ref 30.0–36.0)
MCV: 100.2 fL — ABNORMAL HIGH (ref 78.0–100.0)
MONOS PCT: 7 % (ref 3–12)
Monocytes Absolute: 0.8 10*3/uL (ref 0.1–1.0)
Neutro Abs: 11 10*3/uL — ABNORMAL HIGH (ref 1.7–7.7)
Neutrophils Relative %: 86 % — ABNORMAL HIGH (ref 43–77)
PLATELETS: 160 10*3/uL (ref 150–400)
RBC: 4.46 MIL/uL (ref 4.22–5.81)
RDW: 13.3 % (ref 11.5–15.5)
WBC: 12.6 10*3/uL — ABNORMAL HIGH (ref 4.0–10.5)

## 2014-09-27 LAB — COMPREHENSIVE METABOLIC PANEL
ALK PHOS: 105 U/L (ref 39–117)
ALT: 59 U/L — ABNORMAL HIGH (ref 0–53)
ANION GAP: 8 (ref 5–15)
AST: 61 U/L — ABNORMAL HIGH (ref 0–37)
Albumin: 3.2 g/dL — ABNORMAL LOW (ref 3.5–5.2)
BUN: 6 mg/dL (ref 6–23)
CO2: 34 mEq/L — ABNORMAL HIGH (ref 19–32)
Calcium: 9 mg/dL (ref 8.4–10.5)
Chloride: 95 mEq/L — ABNORMAL LOW (ref 96–112)
Creatinine, Ser: 0.54 mg/dL (ref 0.50–1.35)
GFR calc Af Amer: >90 mL/min (ref >90)
GFR calc non Af Amer: >90 mL/min (ref >90)
Glucose, Bld: 209 mg/dL — ABNORMAL HIGH (ref 70–99)
Potassium: 5.1 mEq/L (ref 3.7–5.3)
Sodium: 137 mEq/L (ref 137–147)
Total Bilirubin: 0.6 mg/dL (ref 0.3–1.2)
Total Protein: 8.1 g/dL (ref 6.0–8.3)

## 2014-09-27 LAB — I-STAT ARTERIAL BLOOD GAS, ED
Acid-Base Excess: 6 mmol/L — ABNORMAL HIGH (ref 0.0–2.0)
Bicarbonate: 33.7 mEq/L — ABNORMAL HIGH (ref 20.0–24.0)
O2 Saturation: 87 %
PCO2 ART: 66.2 mmHg — AB (ref 35.0–45.0)
Patient temperature: 101.3
TCO2: 36 mmol/L (ref 0–100)
pH, Arterial: 7.322 — ABNORMAL LOW (ref 7.350–7.450)
pO2, Arterial: 64 mmHg — ABNORMAL LOW (ref 80.0–100.0)

## 2014-09-27 LAB — URINALYSIS, ROUTINE W REFLEX MICROSCOPIC
Bilirubin Urine: NEGATIVE
Glucose, UA: NEGATIVE mg/dL
KETONES UR: 15 mg/dL — AB
NITRITE: NEGATIVE
PH: 8.5 — AB (ref 5.0–8.0)
Specific Gravity, Urine: 1.028 (ref 1.005–1.030)
Urobilinogen, UA: 1 mg/dL (ref 0.0–1.0)

## 2014-09-27 LAB — AMMONIA: AMMONIA: 52 umol/L (ref 11–60)

## 2014-09-27 LAB — URINE MICROSCOPIC-ADD ON

## 2014-09-27 LAB — STREP PNEUMONIAE URINARY ANTIGEN: STREP PNEUMO URINARY ANTIGEN: NEGATIVE

## 2014-09-27 LAB — I-STAT CG4 LACTIC ACID, ED
LACTIC ACID, VENOUS: 2.07 mmol/L (ref 0.5–2.2)
Lactic Acid, Venous: 2 mmol/L (ref 0.5–2.2)

## 2014-09-27 LAB — GLUCOSE, CAPILLARY: Glucose-Capillary: 250 mg/dL — ABNORMAL HIGH (ref 70–99)

## 2014-09-27 MED ORDER — ACETAMINOPHEN 650 MG RE SUPP
650.0000 mg | Freq: Four times a day (QID) | RECTAL | Status: DC | PRN
  Administered 2014-09-27: 650 mg via RECTAL
  Filled 2014-09-27 (×2): qty 1

## 2014-09-27 MED ORDER — SODIUM CHLORIDE 0.9 % IV SOLN
INTRAVENOUS | Status: DC
  Administered 2014-09-27: 1000 mL via INTRAVENOUS

## 2014-09-27 MED ORDER — LACTULOSE 10 GM/15ML PO SOLN
30.0000 g | Freq: Three times a day (TID) | ORAL | Status: DC
  Administered 2014-09-27 (×2): 30 g via ORAL
  Filled 2014-09-27 (×5): qty 45

## 2014-09-27 MED ORDER — ACETAMINOPHEN 325 MG PO TABS
650.0000 mg | ORAL_TABLET | Freq: Four times a day (QID) | ORAL | Status: DC | PRN
  Administered 2014-09-27: 650 mg via ORAL
  Filled 2014-09-27: qty 2

## 2014-09-27 MED ORDER — FAMOTIDINE 10 MG PO TABS
10.0000 mg | ORAL_TABLET | Freq: Every day | ORAL | Status: DC
  Administered 2014-09-27: 10 mg via ORAL
  Filled 2014-09-27 (×2): qty 1

## 2014-09-27 MED ORDER — SODIUM CHLORIDE 0.9 % IV BOLUS (SEPSIS): 1000.0000 mL | INTRAVENOUS | Status: DC

## 2014-09-27 MED ORDER — CETYLPYRIDINIUM CHLORIDE 0.05 % MT LIQD
7.0000 mL | Freq: Two times a day (BID) | OROMUCOSAL | Status: DC
  Administered 2014-09-27 – 2014-09-30 (×5): 7 mL via OROMUCOSAL

## 2014-09-27 MED ORDER — PRIMIDONE 50 MG PO TABS
200.0000 mg | ORAL_TABLET | Freq: Three times a day (TID) | ORAL | Status: DC
  Administered 2014-09-27 (×2): 200 mg via ORAL
  Filled 2014-09-27 (×5): qty 4

## 2014-09-27 MED ORDER — METOPROLOL TARTRATE 50 MG PO TABS
75.0000 mg | ORAL_TABLET | Freq: Two times a day (BID) | ORAL | Status: DC
  Filled 2014-09-27 (×3): qty 1

## 2014-09-27 MED ORDER — SENNOSIDES-DOCUSATE SODIUM 8.6-50 MG PO TABS
1.0000 | ORAL_TABLET | Freq: Every day | ORAL | Status: DC
  Administered 2014-09-27: 1 via ORAL
  Filled 2014-09-27 (×2): qty 1

## 2014-09-27 MED ORDER — VANCOMYCIN HCL IN DEXTROSE 1-5 GM/200ML-% IV SOLN
1000.0000 mg | Freq: Three times a day (TID) | INTRAVENOUS | Status: DC
  Administered 2014-09-27 – 2014-09-29 (×5): 1000 mg via INTRAVENOUS
  Filled 2014-09-27 (×7): qty 200

## 2014-09-27 MED ORDER — VITAMIN D (ERGOCALCIFEROL) 1.25 MG (50000 UNIT) PO CAPS
50000.0000 [IU] | ORAL_CAPSULE | ORAL | Status: DC
  Filled 2014-09-27: qty 1

## 2014-09-27 MED ORDER — PIPERACILLIN-TAZOBACTAM 3.375 G IVPB 30 MIN
3.3750 g | Freq: Once | INTRAVENOUS | Status: AC
  Administered 2014-09-27: 3.375 g via INTRAVENOUS
  Filled 2014-09-27: qty 50

## 2014-09-27 MED ORDER — PIPERACILLIN-TAZOBACTAM 3.375 G IVPB
3.3750 g | Freq: Three times a day (TID) | INTRAVENOUS | Status: DC
  Administered 2014-09-27 – 2014-09-30 (×7): 3.375 g via INTRAVENOUS
  Filled 2014-09-27 (×9): qty 50

## 2014-09-27 MED ORDER — VANCOMYCIN HCL IN DEXTROSE 1-5 GM/200ML-% IV SOLN: 1000.0000 mg | Freq: Once | INTRAVENOUS | Status: DC

## 2014-09-27 MED ORDER — INSULIN ASPART 100 UNIT/ML ~~LOC~~ SOLN
0.0000 [IU] | Freq: Three times a day (TID) | SUBCUTANEOUS | Status: DC
  Administered 2014-09-27: 3 [IU] via SUBCUTANEOUS
  Administered 2014-09-28: 1 [IU] via SUBCUTANEOUS

## 2014-09-27 MED ORDER — DILTIAZEM HCL 60 MG PO TABS: 120.0000 mg | ORAL_TABLET | Freq: Every day | ORAL | Status: DC

## 2014-09-27 MED ORDER — SODIUM CHLORIDE 0.9 % IV SOLN: INTRAVENOUS | Status: DC

## 2014-09-27 MED ORDER — VANCOMYCIN HCL IN DEXTROSE 1-5 GM/200ML-% IV SOLN
1000.0000 mg | Freq: Once | INTRAVENOUS | Status: AC
  Administered 2014-09-27: 1000 mg via INTRAVENOUS
  Filled 2014-09-27: qty 200

## 2014-09-27 MED ORDER — CARBAMAZEPINE 200 MG PO TABS
300.0000 mg | ORAL_TABLET | Freq: Three times a day (TID) | ORAL | Status: DC
  Administered 2014-09-27 (×2): 300 mg via ORAL
  Filled 2014-09-27 (×5): qty 1.5

## 2014-09-27 MED ORDER — DILTIAZEM HCL ER COATED BEADS 120 MG PO CP24
120.0000 mg | ORAL_CAPSULE | Freq: Every day | ORAL | Status: DC
  Administered 2014-09-27: 120 mg via ORAL
  Filled 2014-09-27 (×2): qty 1

## 2014-09-27 MED ORDER — ENOXAPARIN SODIUM 40 MG/0.4ML ~~LOC~~ SOLN
40.0000 mg | SUBCUTANEOUS | Status: DC
  Administered 2014-09-27 – 2014-09-29 (×3): 40 mg via SUBCUTANEOUS
  Filled 2014-09-27 (×4): qty 0.4

## 2014-09-27 MED ORDER — ASPIRIN 81 MG PO CHEW
81.0000 mg | CHEWABLE_TABLET | Freq: Every day | ORAL | Status: DC
  Filled 2014-09-27: qty 1

## 2014-09-27 MED ORDER — SODIUM CHLORIDE 0.9 % IV BOLUS (SEPSIS)
1000.0000 mL | INTRAVENOUS | Status: AC
  Administered 2014-09-27: 1000 mL via INTRAVENOUS

## 2014-09-27 NOTE — Progress Notes (Signed)
NURSING PROGRESS NOTE  Carlene CoriaMark Milford [id number] Admission Data: 01/07/2014 6:19 PM Attending Provider: Edsel PetrinMaryann Mikhail, DO WUJ:[id number]:Dasanayaka, Newton PiggGayani Y, MD Code Status: DNR  Carlene CoriaMark Ingle is a 58 y.o. male patient admitted from ED:  -No acute distress noted.  -No complaints of shortness of breath.  -No complaints of chest pain.     Blood pressure 123/95, pulse 95, temperature 100.2 F (37.9 C), temperature source Oral, resp. rate 16, height 5\' 7"  (1.702 m), weight 116.2 kg (256 lb 2.8 oz), SpO2 92 %.   IV Fluids:  IV in place, occlusive dsg intact without redness, IV cath wrist left, condition patent and no redness normal saline @ 50.   Allergies:  Review of patient's allergies indicates no known allergies.  Past Medical History:   has a past medical history of Unspecified intellectual disabilities; Excitative type psychosis; CHF (congestive heart failure); Chronic respiratory failure; Atrial fibrillation; Obstructive sleep apnea (adult) (pediatric); Diabetes mellitus without complication; Edema; Hyperlipidemia; Hypertension; Hypopotassemia; Seizures; GERD (gastroesophageal reflux disease); Unspecified constipation; Obesity; and Femoral neck fracture.  Past Surgical History:   has no past surgical history on file.  Social History:   reports that he has been passively smoking.  He does not have any smokeless tobacco history on file. He reports that he does not drink alcohol or use illicit drugs.  Skin: Left pinky toe sutures   Patient/Family orientated to room. Information packet given to patient/family. Admission inpatient armband information verified with patient/family to include name and date of birth and placed on patient arm. Side rails up x 2, fall assessment and education completed with patient/family. Patient/family able to verbalize understanding of risk associated with falls and verbalized understanding to call for assistance before getting out of bed. Call light within reach.  Patient/family able to voice and demonstrate understanding of unit orientation instructions.    Will continue to evaluate and treat per MD orders.

## 2014-09-27 NOTE — ED Notes (Signed)
This RN spoke with Dr. Catha GosselinMikhail,

## 2014-09-27 NOTE — ED Notes (Signed)
Per EMS [facility] with altered [facility] status and elevated ammonia levels.   Patient was given Lactulose 12/17.   118/80 HR 72 CBG 252  Hx of DM

## 2014-09-27 NOTE — Progress Notes (Signed)
ANTIBIOTIC CONSULT NOTE - INITIAL  Pharmacy Consult for Vancomycin/Zosyn Indication: rule out sepsis  No Known Allergies  Patient Measurements: Height: 5\' 7"  (170.2 cm) Weight: 218 lb (98.884 kg) IBW/kg (Calculated) : 66.1 Adjusted Body Weight: 76 kg  Vital Signs: Temp: 101.3 F (38.5 C) (12/20 1031) Temp Source: Rectal (12/20 1031)  Labs: No results for input(s): WBC, HGB, PLT, LABCREA, CREATININE in the last 72 hours. Estimated Creatinine Clearance: 110 mL/min (by C-G formula based on Cr of 0.38).  Microbiology: Recent Results (from the past 720 hour(s))  Urine culture     Status: None   Collection Time: 09/11/14  3:11 PM  Result Value Ref Range Status   Specimen Description URINE, CATHETERIZED  Final   Special Requests ADDED [id number]  Final   Culture  Setup Time   Final    09/12/2014 01:32 Performed at [facility]    Colony Count NO GROWTH Performed at [facility]   Final   Culture NO GROWTH Performed at [facility]   Final   Report Status 09/13/2014 FINAL  Final  Blood Culture (routine x 2)     Status: None   Collection Time: 09/11/14  4:45 PM  Result Value Ref Range Status   Specimen Description BLOOD ARM LEFT  Final   Special Requests BOTTLES DRAWN AEROBIC AND ANAEROBIC 5CC  Final   Culture  Setup Time   Final    09/11/2014 22:25 Performed at [facility]    Culture   Final    NO GROWTH 5 DAYS Performed at [facility]    Report Status 09/17/2014 FINAL  Final  Blood Culture (routine x 2)     Status: None   Collection Time: 09/11/14  4:48 PM  Result Value Ref Range Status   Specimen Description BLOOD ARM RIGHT  Final   Special Requests BOTTLES DRAWN AEROBIC AND ANAEROBIC 5CC  Final   Culture  Setup Time   Final    09/11/2014 22:27 Performed at [facility]    Culture   Final    NO GROWTH 5 DAYS Performed at [facility]    Report Status 09/17/2014 FINAL  Final  MRSA PCR Screening      Status: None   Collection Time: 09/11/14  5:47 PM  Result Value Ref Range Status   MRSA by PCR NEGATIVE NEGATIVE Final    Comment:        The GeneXpert MRSA Assay (FDA approved for NASAL specimens only), is one component of a comprehensive MRSA colonization surveillance program. It is not intended to diagnose MRSA infection nor to guide or monitor treatment for MRSA infections.    Medical History: Past Medical History  Diagnosis Date  . Unspecified intellectual disabilities   . Excitative type psychosis   . CHF (congestive heart failure)   . Chronic respiratory failure   . Atrial fibrillation   . Obstructive sleep apnea (adult) (pediatric)   . Diabetes mellitus without complication   . Edema   . Hyperlipidemia   . Hypertension   . Hypopotassemia   . Seizures   . GERD (gastroesophageal reflux disease)   . Unspecified constipation   . Obesity   . Femoral neck fracture    Medications:  Anti-infectives    Start     Dose/Rate Route Frequency Ordered Stop   11/06/2013 1045  vancomycin (VANCOCIN) IVPB 1000 mg/200 mL premix     1,000 mg200 mL/hr over 60 Minutes Intravenous  Once 11/06/2013 1036  09/17/2014 1030  piperacillin-tazobactam (ZOSYN) IVPB 3.375 g     3.375 g100 mL/hr over 30 Minutes Intravenous  Once 09/15/2014 1017     09/26/2014 1030  vancomycin (VANCOCIN) IVPB 1000 mg/200 mL premix     1,000 mg200 mL/hr over 60 Minutes Intravenous  Once 09/13/2014 1017       Assessment: 58 [facility] is admitted with altered mental status.  He uses a CPAP at night for hypercarbia which may be contributing to ammonia levels..  He had a recent hospitalization as well from 12/4 - 12/11 and at that time he received IV antibiotics with Cefepime and Levofloxacin.  He was discharged on oral Levofloxacin and on 12/13 Cephalexin for a toe laceration.  We have been asked to start him on some IV Vancomycin and Zosyn for sepsis work-up.  He has a normal creatinine and an estimated crcl  of 15110ml/min.  Goal of Therapy:  Vancomycin trough level 15-20 mcg/ml  Plan:  - Vancomycin 2gm IV x 1 then start a maintenance regimen of 1gm every 8 hours - Zosyn 3.375 gm IV every 8 hours - Monitor renal fxn, clinical response and length of therapy - Check Vancomycin levels as appropriate  Nadara MustardNita Shloime Keilman, PharmD., MS Clinical Pharmacist Pager:  [phone] Thank you for allowing pharmacy to be part of this patients care team. 09/17/2014,10:37 AM

## 2014-09-27 NOTE — ED Notes (Signed)
Assisted Steward DroneBrenda, RN and [place], EMT with rectal temperature and then assisted Alvina Chouremaine, EMT with placing a temp foley cath; Steward DroneBrenda, RN and Judie GrieveBryan, RN present in room

## 2014-09-27 NOTE — H&P (Signed)
[facility] History and Physical  Tony CoriaMark Bohnsack UJW:[id number] DOB: April 18, 1956 DOA: 2013-11-13  Referring physician: Dr. Nelva Nayobert Beaton PCP: Angela Coxasanayaka, Gayani Y, MD  Specialists:   Chief Complaint: Altered mental status  HPI: Tony CoriaMark Terry is a 58 y.o. male  With a history of congestive heart failure, chronic respiratory failure, atrial fibrillation, obstructive sleep apnea, diabetes mellitus, that presented to the emergency department with complaints of altered mental status. Patient [facility]. Per brother via phone, patient does have a functioning ability of 58-year-old. He was noticed to be not himself and not eating normally. Patient was somewhat lethargic and more confused as compared to his baseline. Upon arrival to the emergency department, patient was found to be febrile with hypoxia. ABG was conducted and was at patient's baseline. Patient was placed on CPAP.  Chest x-ray showed questionable pneumonia. Of note, patient was recently admitted for hospital-acquired pneumonia approximately one week ago. He was found to have a urinary tract infection in the emergency department. Patient was also found to have elevated ammonia at his prior hospitalization however ammonia level was normal upon admission. [facility] was asked to admit.   Review of Systems:  Unable to obtain as patient has acute encephalopathy.  Past Medical History  Diagnosis Date  . Unspecified intellectual disabilities   . Excitative type psychosis   . CHF (congestive heart failure)   . Chronic respiratory failure   . Atrial fibrillation   . Obstructive sleep apnea (adult) (pediatric)   . Diabetes mellitus without complication   . Edema   . Hyperlipidemia   . Hypertension   . Hypopotassemia   . Seizures   . GERD (gastroesophageal reflux disease)   . Unspecified constipation   . Obesity   . Femoral neck fracture    History reviewed. No pertinent past surgical  history. Social History:  reports that he has been passively smoking.  He does not have any smokeless tobacco history on file. He reports that he does not drink alcohol or use illicit drugs. [facility].  No Known Allergies  Family History  Problem Relation Age of Onset  . Cancer Mother   . Cancer Father     throat cancer   Prior to Admission medications   Medication Sig Start Date End Date Taking? Authorizing Provider  aspirin 81 MG chewable tablet Take 1 tablet by mouth daily for A-Fib  * use house stock*   Yes Historical Provider, MD  carbamazepine (TEGRETOL) 200 MG tablet Take 300 mg by mouth 3 (three) times daily. 1 and 1/2 tablets =300mg    Yes Historical Provider, MD  diltiazem (CARDIZEM) 120 MG tablet Take 120 mg by mouth daily. for HTN   Yes Historical Provider, MD  insulin aspart (NOVOLOG) 100 UNIT/ML injection Inject into the skin. Check BS before meals if 100-150=5 units, if 151 and greater = 10 units  * No Bedtime Dose* for DM *Do Not mix with other insulins*  Expires 28 days after opening.  *check expiration date*   Yes Historical Provider, MD  lactulose (CHRONULAC) 10 GM/15ML solution Take 45 mLs (30 g total) by mouth 2 (two) times daily. Patient taking differently: Take 30 g by mouth 3 (three) times daily.  09/18/14  Yes Russella DarAllison L Ellis, NP  levofloxacin (LEVAQUIN) 500 MG tablet Take 500 mg by mouth daily.   Yes Historical Provider, MD  metoprolol tartrate (LOPRESSOR) 25 MG tablet Take 75 mg by mouth 2 (two) times daily. **Hold for  pulse </=50 and SBP</= 100**   Yes Historical Provider, MD  primidone (MYSOLINE) 50 MG tablet Take 200 mg by mouth 3 (three) times daily.    Yes Historical Provider, MD  ranitidine (ZANTAC) 75 MG tablet Take 75 mg by mouth at bedtime. For GERD   Yes Historical Provider, MD  sennosides-docusate sodium (SENOKOT-S) 8.6-50 MG tablet Take 1 tabley by mouth every night at bedtime for constipation  * Use House Stock*    Yes Historical Provider, MD  traMADol (ULTRAM) 50 MG tablet Take 1 tablet by mouth every 4 hours as needed for pain Patient taking differently: Take 50 mg by mouth every 4 (four) hours as needed (pain).  04/22/14  Yes Kimber Relic, MD  Vitamin D, Ergocalciferol, (DRISDOL) 50000 UNITS CAPS capsule Take 50,000 Units by mouth every 7 (seven) days. MONDAY   Yes Historical Provider, MD  cephALEXin (KEFLEX) 500 MG capsule Take 1 capsule (500 mg total) by mouth 2 (two) times daily. Patient not taking: Reported on 10/04/2014 09/20/14   Juliet Rude. Rubin Payor, MD   Physical Exam: Filed Vitals:   10/05/2014 1133  BP:   Pulse:   Temp: 102.4 F (39.1 C)     General: Well developed, well nourished, NAD, appears stated age  HEENT: NCAT, Unable to assess pupils- tightly shut, mucous membranes dry   Neck: Supple, no JVD, no masses  Cardiovascular: S1 S2 auscultated, no rubs, murmurs or gallops. Regular rate and rhythm.  Respiratory: Diminished but clear, currently on CPAP  Abdomen: Soft, obese, nontender, nondistended, + bowel sounds  Extremities: warm dry without cyanosis clubbing or edema  Neuro: Unable to assess secondary to encephalopathy  Skin: Without rashes exudates or nodules, wound on left fifth toe, sutures in place  Psych: Unable to assess secondary to encephalopathy  Labs on Admission:  Basic Metabolic Panel:  Recent Labs Lab 10/06/2014 1050  NA 137  K 5.1  CL 95*  CO2 34*  GLUCOSE 209*  BUN 6  CREATININE 0.54  CALCIUM 9.0   Liver Function Tests:  Recent Labs Lab 09/15/2014 1050  AST 61*  ALT 59*  ALKPHOS 105  BILITOT 0.6  PROT 8.1  ALBUMIN 3.2*   No results for input(s): LIPASE, AMYLASE in the last 168 hours.  Recent Labs Lab 09/28/2014 1050  AMMONIA 52   CBC:  Recent Labs Lab 09/24/2014 1050  WBC 12.6*  NEUTROABS 11.0*  HGB 14.1  HCT 44.7  MCV 100.2*  PLT 160   Cardiac Enzymes: No results for input(s): CKTOTAL, CKMB, CKMBINDEX, TROPONINI in the  last 168 hours.  BNP (last 3 results)  Recent Labs  09/11/14 1418  PROBNP 64.9   CBG: No results for input(s): GLUCAP in the last 168 hours.  Radiological Exams on Admission: Dg Chest Port 1 View  09/25/2014   CLINICAL DATA:  Altered mental status. Patient was unable to follow breathing instructions. Cough since this morning.  EXAM: PORTABLE CHEST - 1 VIEW  COMPARISON:  09/13/2014  FINDINGS: Patient is rotated towards the left. Densities at the left lung base are concerning for volume loss or airspace disease. Overall, low lung volumes. No focal disease in the right lung. Limited evaluation of the cardiac silhouette. Negative for a large pneumothorax.  IMPRESSION: Concern for densities in the left lower lung. Differential diagnosis includes atelectasis versus airspace disease. Limited exam due to patient positioning.   Electronically Signed   By: Richarda Overlie M.D.   On: 09/16/2014 10:44    EKG: Independently reviewed. Sinus  rhythm, rate 83  Assessment/Plan  Severe sepsis secondary to urinary tract infection versus possible HCAP -Patient will be admitted to medical floor -Upon admission patient is febrile with a leukocytosis of 12.8 -Will continue him on broad-spectrum antibiotics, vancomycin and Zosyn per pharmacy -UA: WBC 21-50, large leukocytes -CXR: Concern for densities in left lower lung, atelectasis versus airspace disease -Will obtain urine culture, sputum culture Gram stain, urine Legionella and strep pneumonia antigens -Will place him on gentle IV fluids  Acute encephalopathy -Likely secondary to urinary tract infection versus pneumonia vs hypoxia -Will conduct neuro checks -Ammonia level normal  Chronic hypoxic respiratory failure/obstructive sleep apnea -Patient's ABG does appear to be at baseline, will continue him on supplemental oxygen to maintain saturations above 92% as well as the Pap at night  ? Chronic diastolic heart failure -Last echocardiogram 09/12/2014  showed an EF of 60-65%, diastolic function was unable to be estimated due to technically insufficient study  Diabetes mellitus, type II -Will continue patient on insulin sliding scale was CBG monitoring -Last hemoglobin A1c on 09/11/2014: 6.0  Seizure disorder -Continue Tegretol  Mild nonobstructive transaminitis/?Cirrhosis with hyperammonemia -LFTs do appear to be at baseline and have been chronically elevated -Will continue to monitor CMP -Ammonia level currently normal, will continue lactulose  Chronic atrial fibrillation -Currently rate and rhythm controlled, and sinus rhythm -Continue aspirin therapy -Patient is not on any anticoagulation, CHADs score 3  Left fifth metatarsal wound -Wound care will be consulted, patient will be placed on broad-spectrum antibiotics  Intellectual disability -Currently complicated by acute encephalopathy -Per brother, patient has functioning ability of a 58-year-old  DVT prophylaxis: Lovenox  Code Status: DNR  Condition: Guarded  Family Communication: None at bedside.  Brother, Kathlene NovemberMike, via phone.  Admission, patients condition and plan of care including tests being ordered have been discussed with the patient's brother, who indicates understanding and agrees with the plan and Code Status.  Disposition Plan: Admitted   Time spent: 60 minutes  Jamie Hafford D.O. [facility] Pager [phone]  If 7PM-7AM, please contact night-coverage [email or web address] Password [email or web address] Sep 22, 2014, 1:00 PM

## 2014-09-27 NOTE — Progress Notes (Signed)
Pt taken off CPAP at this time and placed on 2L [place]. Tolerating well with no distress noted. RT will continue to monitor.

## 2014-09-27 NOTE — Progress Notes (Signed)
PO Lopressor not given. Patient alert to voice but very sleepy. Will not stay awake long enough for safe PO intake. Will continue to monitor.

## 2014-09-27 NOTE — ED Provider Notes (Signed)
CSN: [id number]     Arrival date & time 09/29/2014  4098 History   First MD Initiated Contact with Patient 09/10/2014 [phone]     Chief Complaint  Patient presents with  . Altered Mental Status     (Consider location/radiation/quality/duration/timing/severity/associated sxs/prior Treatment) HPI Patient is a 58yo male with h/o MR, chronic respiratory failure, CHF and cirrhosis who presents with altered mental status. According to his brother and the nursing home staff, he has not been eating normally and has been more confused and lethargic compared to his normal. He had an elevated ammonia to 126 on 12/17. On arrival he was febrile, hypoxic and hypercapnic.   He was recently admitted for AMS and found to have elevated ammonia level and improved with lactulose which he has continued at the nursing home. He was in the ED last week with an open fracture of his left 5th toe which required sutures and he was sent home with a postop shoe and 5 day course of keflex.  Past Medical History  Diagnosis Date  . Unspecified intellectual disabilities   . Excitative type psychosis   . CHF (congestive heart failure)   . Chronic respiratory failure   . Atrial fibrillation   . Obstructive sleep apnea (adult) (pediatric)   . Diabetes mellitus without complication   . Edema   . Hyperlipidemia   . Hypertension   . Hypopotassemia   . Seizures   . GERD (gastroesophageal reflux disease)   . Unspecified constipation   . Obesity   . Femoral neck fracture    History reviewed. No pertinent past surgical history. Family History  Problem Relation Age of Onset  . Cancer Mother   . Cancer Father     throat cancer   History  Substance Use Topics  . Smoking status: Passive Smoke Exposure - Never Smoker  . Smokeless tobacco: Not on file  . Alcohol Use: No    Review of Systems  Unable to perform ROS   Allergies  Review of patient's allergies indicates no known allergies.  Home Medications   Prior to  Admission medications   Medication Sig Start Date End Date Taking? Authorizing Provider  aspirin 81 MG chewable tablet Take 1 tablet by mouth daily for A-Fib  * use house stock*   Yes Historical Provider, MD  carbamazepine (TEGRETOL) 200 MG tablet Take 300 mg by mouth 3 (three) times daily. 1 and 1/2 tablets =300mg    Yes Historical Provider, MD  diltiazem (CARDIZEM) 120 MG tablet Take 120 mg by mouth daily. for HTN   Yes Historical Provider, MD  insulin aspart (NOVOLOG) 100 UNIT/ML injection Inject into the skin. Check BS before meals if 100-150=5 units, if 151 and greater = 10 units  * No Bedtime Dose* for DM *Do Not mix with other insulins*  Expires 28 days after opening.  *check expiration date*   Yes Historical Provider, MD  lactulose (CHRONULAC) 10 GM/15ML solution Take 45 mLs (30 g total) by mouth 2 (two) times daily. Patient taking differently: Take 30 g by mouth 3 (three) times daily.  09/18/14  Yes Russella Dar, NP  levofloxacin (LEVAQUIN) 500 MG tablet Take 500 mg by mouth daily.   Yes Historical Provider, MD  metoprolol tartrate (LOPRESSOR) 25 MG tablet Take 75 mg by mouth 2 (two) times daily. **Hold for pulse </=50 and SBP</= 100**   Yes Historical Provider, MD  primidone (MYSOLINE) 50 MG tablet Take 200 mg by mouth 3 (three) times daily.    Yes  Historical Provider, MD  ranitidine (ZANTAC) 75 MG tablet Take 75 mg by mouth at bedtime. For GERD   Yes Historical Provider, MD  sennosides-docusate sodium (SENOKOT-S) 8.6-50 MG tablet Take 1 tabley by mouth every night at bedtime for constipation  * Use House Stock*   Yes Historical Provider, MD  traMADol (ULTRAM) 50 MG tablet Take 1 tablet by mouth every 4 hours as needed for pain Patient taking differently: Take 50 mg by mouth every 4 (four) hours as needed (pain).  04/22/14  Yes Kimber RelicArthur G Green, MD  Vitamin D, Ergocalciferol, (DRISDOL) 50000 UNITS CAPS capsule Take 50,000 Units by mouth every 7 (seven) days. MONDAY   Yes Historical Provider,  MD  cephALEXin (KEFLEX) 500 MG capsule Take 1 capsule (500 mg total) by mouth 2 (two) times daily. Patient not taking: Reported on 25-Jun-2014 09/20/14   Juliet RudeNathan R. Pickering, MD   BP 163/74 mmHg  Pulse 89  Temp(Src) 102.4 F (39.1 C) (Core (Comment))  Ht 5\' 7"  (1.702 m)  Wt 218 lb (98.884 kg)  BMI 34.14 kg/m2  SpO2 87% Physical Exam  Constitutional: He is oriented to person, place, and time. He appears well-developed and well-nourished. No distress.  HENT:  Head: Normocephalic and atraumatic.  Eyes: Conjunctivae are normal. Pupils are equal, round, and reactive to light. Right eye exhibits no discharge. Left eye exhibits no discharge. No scleral icterus.  Cardiovascular: Normal rate, regular rhythm and normal heart sounds.   No murmur heard. Pulmonary/Chest:  Mildly labored breathing, poor aeration throughout without focal consolidation  Abdominal: Soft. Bowel sounds are normal. He exhibits no distension. There is no tenderness.  Musculoskeletal:       Left foot: There is tenderness.       Feet:  Neurological: He is alert and oriented to person, place, and time.  Skin: Skin is warm. He is diaphoretic.  Psychiatric: He has a normal mood and affect. His behavior is normal.  Nursing note and vitals reviewed.   ED Course  Procedures (including critical care time) Labs Review Labs Reviewed  CBC WITH DIFFERENTIAL - Abnormal; Notable for the following:    WBC 12.6 (*)    MCV 100.2 (*)    Neutrophils Relative % 86 (*)    Neutro Abs 11.0 (*)    Lymphocytes Relative 6 (*)    All other components within normal limits  COMPREHENSIVE METABOLIC PANEL - Abnormal; Notable for the following:    Chloride 95 (*)    CO2 34 (*)    Glucose, Bld 209 (*)    Albumin 3.2 (*)    AST 61 (*)    ALT 59 (*)    All other components within normal limits  URINALYSIS, ROUTINE W REFLEX MICROSCOPIC - Abnormal; Notable for the following:    Color, Urine AMBER (*)    APPearance CLOUDY (*)    pH 8.5 (*)     Hgb urine dipstick MODERATE (*)    Ketones, ur 15 (*)    Protein, ur >300 (*)    Leukocytes, UA LARGE (*)    All other components within normal limits  URINE MICROSCOPIC-ADD ON - Abnormal; Notable for the following:    Squamous Epithelial / LPF FEW (*)    All other components within normal limits  I-STAT ARTERIAL BLOOD GAS, ED - Abnormal; Notable for the following:    pH, Arterial 7.322 (*)    pCO2 arterial 66.2 (*)    pO2, Arterial 64.0 (*)    Bicarbonate 33.7 (*)  Acid-Base Excess 6.0 (*)    All other components within normal limits  CULTURE, BLOOD (ROUTINE X 2)  CULTURE, BLOOD (ROUTINE X 2)  URINE CULTURE  CULTURE, EXPECTORATED SPUTUM-ASSESSMENT  AMMONIA  BLOOD GAS, ARTERIAL  I-STAT CG4 LACTIC ACID, ED    Imaging Review Dg Chest Port 1 View  09/29/2014   CLINICAL DATA:  Altered mental status. Patient was unable to follow breathing instructions. Cough since this morning.  EXAM: PORTABLE CHEST - 1 VIEW  COMPARISON:  09/13/2014  FINDINGS: Patient is rotated towards the left. Densities at the left lung base are concerning for volume loss or airspace disease. Overall, low lung volumes. No focal disease in the right lung. Limited evaluation of the cardiac silhouette. Negative for a large pneumothorax.  IMPRESSION: Concern for densities in the left lower lung. Differential diagnosis includes atelectasis versus airspace disease. Limited exam due to patient positioning.   Electronically Signed   By: Richarda OverlieAdam  Henn M.D.   On: 09/09/2014 10:44     EKG Interpretation None      MDM   Final diagnoses:  Sepsis, due to unspecified organism   Sepsis, AMS. Mostly likely sources are pneumonia or UTI but cellulitis or osteo at previous fracture site also possible. CXR poor quality of read as possible LLL pneumonia.   ABG with resp acidosis and hypercapnea but better than baseline. Will place on cpap as tolerated.  AMS is likely related to sepsis with ammonia of 52.  UA grossly  abnormal, likely urosepsis causing AMS. Will consult [facility] for admission.    Abram SanderElena M Keen Ewalt, MD 09/28/2014 1237  Nelia Shiobert L Beaton, MD 09/15/2014 1240  Nelia Shiobert L Beaton, MD 09/10/2014 [phone]

## 2014-09-27 NOTE — Progress Notes (Signed)
RN attempt to call for report, ED RN will call back.

## 2014-09-28 ENCOUNTER — Inpatient Hospital Stay (HOSPITAL_COMMUNITY): Payer: Medicare Other

## 2014-09-28 LAB — BLOOD GAS, ARTERIAL
Acid-Base Excess: 6.6 mmol/L — ABNORMAL HIGH (ref 0.0–2.0)
Bicarbonate: 32.1 mEq/L — ABNORMAL HIGH (ref 20.0–24.0)
DRAWN BY: 31996
O2 CONTENT: 6 L/min
O2 Saturation: 91.8 %
PCO2 ART: 59.7 mmHg — AB (ref 35.0–45.0)
PH ART: 7.349 — AB (ref 7.350–7.450)
PO2 ART: 64.5 mmHg — AB (ref 80.0–100.0)
Patient temperature: 98.6
TCO2: 33.9 mmol/L (ref 0–100)

## 2014-09-28 LAB — GLUCOSE, CAPILLARY
GLUCOSE-CAPILLARY: 211 mg/dL — AB (ref 70–99)
GLUCOSE-CAPILLARY: 153 mg/dL — AB (ref 70–99)
Glucose-Capillary: 161 mg/dL — ABNORMAL HIGH (ref 70–99)
Glucose-Capillary: 127 mg/dL — ABNORMAL HIGH (ref 70–99)
Glucose-Capillary: 129 mg/dL — ABNORMAL HIGH (ref 70–99)

## 2014-09-28 LAB — CARBAMAZEPINE LEVEL, TOTAL: CARBAMAZEPINE LVL: 10.1 ug/mL (ref 4.0–12.0)

## 2014-09-28 MED ORDER — INSULIN ASPART 100 UNIT/ML ~~LOC~~ SOLN
0.0000 [IU] | SUBCUTANEOUS | Status: DC
  Administered 2014-09-28: 1 [IU] via SUBCUTANEOUS
  Administered 2014-09-28: 3 [IU] via SUBCUTANEOUS
  Administered 2014-09-28: 2 [IU] via SUBCUTANEOUS
  Administered 2014-09-29: 1 [IU] via SUBCUTANEOUS
  Administered 2014-09-29: 2 [IU] via SUBCUTANEOUS
  Administered 2014-09-29 (×2): 1 [IU] via SUBCUTANEOUS
  Administered 2014-09-29: 2 [IU] via SUBCUTANEOUS
  Administered 2014-09-29: 1 [IU] via SUBCUTANEOUS
  Administered 2014-09-30: 5 [IU] via SUBCUTANEOUS
  Administered 2014-09-30 (×3): 2 [IU] via SUBCUTANEOUS

## 2014-09-28 MED ORDER — LACTULOSE ENEMA
300.0000 mL | Freq: Every day | ORAL | Status: DC
  Administered 2014-09-28 – 2014-09-30 (×3): 300 mL via RECTAL
  Filled 2014-09-28 (×6): qty 300

## 2014-09-28 MED ORDER — SODIUM CHLORIDE 0.9 % IV SOLN: INTRAVENOUS | Status: DC

## 2014-09-28 MED ORDER — LEVETIRACETAM IN NACL 500 MG/100ML IV SOLN
500.0000 mg | Freq: Two times a day (BID) | INTRAVENOUS | Status: DC
  Administered 2014-09-28 – 2014-09-29 (×2): 500 mg via INTRAVENOUS
  Filled 2014-09-28 (×3): qty 100

## 2014-09-28 MED ORDER — LEVETIRACETAM IN NACL 1000 MG/100ML IV SOLN
1000.0000 mg | INTRAVENOUS | Status: AC
  Administered 2014-09-28: 1000 mg via INTRAVENOUS
  Filled 2014-09-28: qty 100

## 2014-09-28 MED ORDER — PANTOPRAZOLE SODIUM 40 MG IV SOLR
40.0000 mg | INTRAVENOUS | Status: DC
  Administered 2014-09-28 – 2014-09-30 (×3): 40 mg via INTRAVENOUS
  Filled 2014-09-28 (×3): qty 40

## 2014-09-28 MED ORDER — PANTOPRAZOLE SODIUM 40 MG PO TBEC: 40.0000 mg | DELAYED_RELEASE_TABLET | Freq: Every day | ORAL | Status: DC

## 2014-09-28 MED ORDER — METOPROLOL TARTRATE 1 MG/ML IV SOLN
5.0000 mg | Freq: Four times a day (QID) | INTRAVENOUS | Status: DC
  Administered 2014-09-28 – 2014-09-30 (×7): 5 mg via INTRAVENOUS
  Filled 2014-09-28 (×12): qty 5

## 2014-09-28 NOTE — Consult Note (Signed)
NEURO HOSPITALIST CONSULT NOTE    Reason for Consult: seizure versus myoclonus in setting of infection.   HPI:                                                                                                                                          Tony Terry is an 58 y.o. male  With a history of congestive heart failure, chronic respiratory failure, atrial fibrillation, obstructive sleep apnea, diabetes mellitus, that presented to the emergency department with complaints of altered mental status. [facility]. Per brother via phone, patient does have a functioning ability of 89-year-old. He was noticed to be not himself and not eating normally. Patient was somewhat lethargic and more confused as compared to his baseline. Upon arrival to the emergency department, patient was found to be febrile with hypoxia.He was noted to be septic with UTI and possible PNA. Blood cultures and urine culture pending.  Currently on Vancomycin and Zosyn.    Patient has known seizure disorder and currently on Primidone 200 mg TID and Tegretol 300 mg TID.  Today he was noted to have arm twitching concerning for myoclonus versus seizure.     Past Medical History  Diagnosis Date  . Unspecified intellectual disabilities   . Excitative type psychosis   . CHF (congestive heart failure)   . Chronic respiratory failure   . Atrial fibrillation   . Obstructive sleep apnea (adult) (pediatric)   . Diabetes mellitus without complication   . Edema   . Hyperlipidemia   . Hypertension   . Hypopotassemia   . Seizures   . GERD (gastroesophageal reflux disease)   . Unspecified constipation   . Obesity   . Femoral neck fracture     History reviewed. No pertinent past surgical history.  Family History  Problem Relation Age of Onset  . Cancer Mother   . Cancer Father     throat cancer     Social History:  reports that he has been passively smoking.   He does not have any smokeless tobacco history on file. He reports that he does not drink alcohol or use illicit drugs.  No Known Allergies  MEDICATIONS:  Prior to Admission:  Prescriptions prior to admission  Medication Sig Dispense Refill Last Dose  . aspirin 81 MG chewable tablet Take 1 tablet by mouth daily for A-Fib  * use house stock*   Unk  . carbamazepine (TEGRETOL) 200 MG tablet Take 300 mg by mouth 3 (three) times daily. 1 and 1/2 tablets =300mg    Unk  . diltiazem (CARDIZEM) 120 MG tablet Take 120 mg by mouth daily. for HTN   Unk  . insulin aspart (NOVOLOG) 100 UNIT/ML injection Inject into the skin. Check BS before meals if 100-150=5 units, if 151 and greater = 10 units  * No Bedtime Dose* for DM *Do Not mix with other insulins*  Expires 28 days after opening.  *check expiration date*   Unk  . lactulose (CHRONULAC) 10 GM/15ML solution Take 45 mLs (30 g total) by mouth 2 (two) times daily. (Patient taking differently: Take 30 g by mouth 3 (three) times daily. ) 240 mL 0 Unk  . levofloxacin (LEVAQUIN) 500 MG tablet Take 500 mg by mouth daily.   Unk  . metoprolol tartrate (LOPRESSOR) 25 MG tablet Take 75 mg by mouth 2 (two) times daily. **Hold for pulse </=50 and SBP</= 100**   Unk  . primidone (MYSOLINE) 50 MG tablet Take 200 mg by mouth 3 (three) times daily.    Unk  . ranitidine (ZANTAC) 75 MG tablet Take 75 mg by mouth at bedtime. For GERD   Unk  . sennosides-docusate sodium (SENOKOT-S) 8.6-50 MG tablet Take 1 tabley by mouth every night at bedtime for constipation  * Use House Stock*   Unk  . traMADol (ULTRAM) 50 MG tablet Take 1 tablet by mouth every 4 hours as needed for pain (Patient taking differently: Take 50 mg by mouth every 4 (four) hours as needed (pain). ) 180 tablet 5 Unk  . Vitamin D, Ergocalciferol, (DRISDOL) 50000 UNITS CAPS capsule Take 50,000 Units by  mouth every 7 (seven) days. MONDAY   Unk  . cephALEXin (KEFLEX) 500 MG capsule Take 1 capsule (500 mg total) by mouth 2 (two) times daily. (Patient not taking: Reported on 2014-09-28) 10 capsule 0 Not Taking at Unknown time   Scheduled: . antiseptic oral rinse  7 mL Mouth Rinse BID  . aspirin  81 mg Oral Daily  . carbamazepine  300 mg Oral TID  . diltiazem  120 mg Oral Daily  . enoxaparin (LOVENOX) injection  40 mg Subcutaneous Q24H  . famotidine  10 mg Oral QHS  . insulin aspart  0-9 Units Subcutaneous TID WC  . lactulose  30 g Oral TID  . metoprolol tartrate  75 mg Oral BID  . piperacillin-tazobactam (ZOSYN)  IV  3.375 g Intravenous 3 times per day  . primidone  200 mg Oral TID  . senna-docusate  1 tablet Oral Daily  . vancomycin  1,000 mg Intravenous Once  . vancomycin  1,000 mg Intravenous Q8H  . Vitamin D (Ergocalciferol)  50,000 Units Oral Q7 days   Continuous:    ROS:  History obtained from unobtainable from patient due to mental status    Blood pressure 105/52, pulse 95, temperature 98.9 F (37.2 C), temperature source Axillary, resp. rate 22, height 5\' 7"  (1.702 m), weight 117.2 kg (258 lb 6.1 oz), SpO2 98 %.   Neurologic Examination:                                                                                                      HEENT-  Normocephalic, no lesions, without obvious abnormality.  Normal external eye and conjunctiva.  Normal TM's bilaterally.  Normal auditory canals and external ears. Normal external nose, mucus membranes and septum.  Normal pharynx. Cardiovascular- regular rate and rhythm, S1, S2 normal, no murmur, click, rub or gallop, pulses palpable throughout   Lungs- chest clear, no wheezing, rales, normal symmetric air entry Abdomen- soft, non-tender; bowel sounds normal; no masses,  no organomegaly Extremities- less  then 2 second capillary refill Lymph-no adenopathy palpable Musculoskeletal-no joint tenderness, deformity or swelling Skin-warm and dry, no hyperpigmentation, vitiligo, or suspicious lesions  Neurological Examination Mental Status: Alert, he only moans when painful stimulation given.  Follows only very simple commands such as wiggling toes and squeezing hand.  Cranial Nerves: II: Discs flat bilaterally;blinks to threat bilatearlly, pupils equal, round, reactive to light and accommodation III,IV, VI: ptosis not present, extra-ocular motions intact bilaterally V,VII: face symmetric,winces to stimulation bilaterally VIII: hearing normal bilaterally IX,X: gag reflex present XI: bilateral shoulder shrug XII: would not perform Motor: Moves right arm antigravity, left arm shows no abnormal movements at rest but when asked to sqeeze hand and move arm his left arm shows increased tone with what looks to be myoclonus. Does not move bilateral LE antigravity and wiggles toes to command.  Sensory: Pinprick and light touch intact throughout, bilaterally Deep Tendon Reflexes: 2+ and symmetric throughout UE and shows 1+ reflexes in KJ.  No AJ Plantars: Mute bilaterally Cerebellar: Unable to perform.  Gait: not assessed due to safety and strength.       Lab Results: Basic Metabolic Panel:  Recent Labs Lab Oct 23, 2014 1050  NA 137  K 5.1  CL 95*  CO2 34*  GLUCOSE 209*  BUN 6  CREATININE 0.54  CALCIUM 9.0    Liver Function Tests:  Recent Labs Lab 2014/10/23 1050  AST 61*  ALT 59*  ALKPHOS 105  BILITOT 0.6  PROT 8.1  ALBUMIN 3.2*   No results for input(s): LIPASE, AMYLASE in the last 168 hours.  Recent Labs Lab Oct 23, 2014 1050  AMMONIA 52    CBC:  Recent Labs Lab 2014/10/23 1050  WBC 12.6*  NEUTROABS 11.0*  HGB 14.1  HCT 44.7  MCV 100.2*  PLT 160    Cardiac Enzymes: No results for input(s): CKTOTAL, CKMB, CKMBINDEX, TROPONINI in the last 168 hours.  Lipid  Panel: No results for input(s): CHOL, TRIG, HDL, CHOLHDL, VLDL, LDLCALC in the last 168 hours.  CBG:  Recent Labs Lab 2014/10/23 1704 Oct 23, 2014 2322 09/28/14 0802  GLUCAP 250* 161* 127*    Microbiology: Results for orders placed or performed during the hospital  encounter of 09/11/14  Urine culture     Status: None   Collection Time: 09/11/14  3:11 PM  Result Value Ref Range Status   Specimen Description URINE, CATHETERIZED  Final   Special Requests ADDED [phone]  Final   Culture  Setup Time   Final    09/12/2014 01:32 Performed at [facility]    Colony Count NO GROWTH Performed at [facility]   Final   Culture NO GROWTH Performed at [facility]   Final   Report Status 09/13/2014 FINAL  Final  Blood Culture (routine x 2)     Status: None   Collection Time: 09/11/14  4:45 PM  Result Value Ref Range Status   Specimen Description BLOOD ARM LEFT  Final   Special Requests BOTTLES DRAWN AEROBIC AND ANAEROBIC 5CC  Final   Culture  Setup Time   Final    09/11/2014 22:25 Performed at [facility]    Culture   Final    NO GROWTH 5 DAYS Performed at [facility]    Report Status 09/17/2014 FINAL  Final  Blood Culture (routine x 2)     Status: None   Collection Time: 09/11/14  4:48 PM  Result Value Ref Range Status   Specimen Description BLOOD ARM RIGHT  Final   Special Requests BOTTLES DRAWN AEROBIC AND ANAEROBIC 5CC  Final   Culture  Setup Time   Final    09/11/2014 22:27 Performed at [facility]    Culture   Final    NO GROWTH 5 DAYS Performed at [facility]    Report Status 09/17/2014 FINAL  Final  MRSA PCR Screening     Status: None   Collection Time: 09/11/14  5:47 PM  Result Value Ref Range Status   MRSA by PCR NEGATIVE NEGATIVE Final    Comment:        The GeneXpert MRSA Assay (FDA approved for NASAL specimens only), is one component of a comprehensive MRSA colonization surveillance program.  It is not intended to diagnose MRSA infection nor to guide or monitor treatment for MRSA infections.     Coagulation Studies: No results for input(s): LABPROT, INR in the last 72 hours.  Imaging: Dg Chest Port 1 View  09/10/2014   CLINICAL DATA:  Altered mental status. Patient was unable to follow breathing instructions. Cough since this morning.  EXAM: PORTABLE CHEST - 1 VIEW  COMPARISON:  09/13/2014  FINDINGS: Patient is rotated towards the left. Densities at the left lung base are concerning for volume loss or airspace disease. Overall, low lung volumes. No focal disease in the right lung. Limited evaluation of the cardiac silhouette. Negative for a large pneumothorax.  IMPRESSION: Concern for densities in the left lower lung. Differential diagnosis includes atelectasis versus airspace disease. Limited exam due to patient positioning.   Electronically Signed   By: Richarda OverlieAdam  Henn M.D.   On: 09/16/2014 10:44       Assessment and plan per attending neurologist  Felicie Mornavid Smith PA-C [facility] [phone]  09/28/2014, 10:28 AM   Assessment/Plan:  58 YO male presenting to hospital with UTI and sepsis.  Prior to hospitalization he was on both Levaquin and Keflex.  Upon arrival patient was changed to Vancomycin and Zosyn.  While in hospital he was noted to have left arm movement concerning for possible seizure.  On exam patient shows no abnormal movements at rest but when patient moves arm he exhibits what appears to  be asterixis of left arm.   Recommend: 1) EEG to further evaluate left arm movement 2) He has been placed on NPO diet--will add Keppra 1 Gram now and then 500 mg BID IV 3) If remains NPO tomorrow, he will need NG tube placed in order to receive his tegretol and Primidone.  4) would favor avoiding fluoroquinolones if possible.   Ritta Slot, MD [facility] [phone]  If 7pm- 7am, please page neurology on call as listed in AMION.

## 2014-09-28 NOTE — Progress Notes (Signed)
PROGRESS NOTE  Tony CoriaMark Terry ZOX:[id number] DOB: 1956-02-04 DOA: 2014/04/22 PCP: Angela Coxasanayaka, Gayani Y, MD  Tony CoriaMark Terry is a 58 y.o. male with a history of diastolic heart failure, chronic respiratory failure, atrial fibrillation, obstructive sleep apnea, diabetes mellitus, that presented to the emergency department with complaints of altered mental status.  Patient was somewhat lethargic and more confused as compared to his baseline. Upon arrival to the emergency department, patient was found to be febrile with hypoxia. He was found to have a urinary tract infection in the emergency department.  Of note,  Mr. Tony Terry, was admitted for hospital-acquired pneumonia one week ago.  He was also found to have elevated ammonia at his prior hospitalization however ammonia level was normal upon admission.    Assessment/Plan:  Severe sepsis secondary to urinary tract infection versus possible HCAP Vancomycin and Zosyn per pharmacy UA: WBC 21-50, large leukocytes CXR: Concern for densities in left lower lung, atelectasis versus airspace disease Blood cultures pending, Urine culture pending, sputum culture not yet obtained , urine legionella and strep pneumonia antigens are both negative. Gentle IV fluids  Acute encephalopathy Still lethargic, but ABG improved over admission.  Encephalopathy likely due to infection, but we are concerned for possible seizure as well. NPO until seen by speech.   Ammonia level normal at the time of admission.  Will continue lactulose PR  Seizure disorder Patient with myoclonic jerks.  Neurology consulted.  EEG ordered.  Keppa IV started (as patient is NPO and currently unable to take primidone or tegretol)  Chronic hypoxic respiratory failure/obstructive sleep apnea Patient's abg is improved slightly despite his clinical appearance.  Oxygen sats are maintaining well on supplemental oxygen.  Coughing into CPAP this am - so it was temporarily removed.  Chronic  diastolic heart failure Last echocardiogram 09/12/2014 showed an EF of 60-65%, No frank lower extremity edema or JVD noted.  BB continued IV.  Diabetes mellitus, type II Insulin SSI q 4 hours while NPO. Last hemoglobin A1c on 09/11/2014: 6.0  Mild nonobstructive transaminitis Transaminases are mildly elevated.  They were normal 2 weeks ago.   Question medications? - tegretol Ammonia level was previously elevated.  Currently normal, will continue lactulose. Check Acute Hep panel in am.  Chronic atrial fibrillation Currently rate and rhythm controlled, and sinus rhythm Continue aspirin therapy when able to take POs. Patient is not on any anticoagulation, CHADs score 3  Left fifth metatarsal wound Wound care will be consulted, patient will be placed on broad-spectrum antibiotics  Intellectual disability Currently complicated by acute encephalopathy Per brother, patient has functioning ability of a 58-year-old     DVT Prophylaxis:  lovenox  Code Status: DNR Family Communication:  Disposition Plan: Inpatient   Consultants: Neuro  Procedures:   Antibiotics: Anti-infectives    Start     Dose/Rate Route Frequency Ordered Stop   02-03-2014 2000  piperacillin-tazobactam (ZOSYN) IVPB 3.375 g     3.375 g12.5 mL/hr over 240 Minutes Intravenous 3 times per day 02-03-2014 1051     02-03-2014 1900  vancomycin (VANCOCIN) IVPB 1000 mg/200 mL premix     1,000 mg200 mL/hr over 60 Minutes Intravenous Every 8 hours 02-03-2014 1051     02-03-2014 1045  vancomycin (VANCOCIN) IVPB 1000 mg/200 mL premix     1,000 mg200 mL/hr over 60 Minutes Intravenous  Once 02-03-2014 1036     02-03-2014 1030  piperacillin-tazobactam (ZOSYN) IVPB 3.375 g     3.375 g100 mL/hr over 30 Minutes Intravenous  Once 02-03-2014 1017 02-03-2014  1445   October 01, 2014 1030  vancomycin (VANCOCIN) IVPB 1000 mg/200 mL premix     1,000 mg200 mL/hr over 60 Minutes Intravenous  Once 10-01-2014 1017 01-Oct-2014 1445        HPI/Subjective: Patient  unable to communicate.  Too lethargic.  Objective: Filed Vitals:   09/28/14 0114 09/28/14 0136 09/28/14 0146 09/28/14 0620  BP:   124/50 105/52  Pulse: 101  92 95  Temp:   98.4 F (36.9 C) 98.9 F (37.2 C)  TempSrc:   Oral Axillary  Resp: 22   22  Height:      Weight:    117.2 kg (258 lb 6.1 oz)  SpO2: 93% 93% 98% 98%    Intake/Output Summary (Last 24 hours) at 09/28/14 1249 Last data filed at 09/28/14 1050  Gross per 24 hour  Intake   1770 ml  Output    450 ml  Net   1320 ml   Filed Weights   10/01/14 0958 10/01/2014 1501 09/28/14 0620  Weight: 98.884 kg (218 lb) 116.2 kg (256 lb 2.8 oz) 117.2 kg (258 lb 6.1 oz)    Exam: General: Obese male, lethargic in bed.  Does not respond to my exam until I open his eye lids. HEENT:  PERR, scelera clear. MMM, N/C in place. Neck: Thick, Supple, no JVD, no masses  Cardiovascular: RRR, S1 S2 auscultated, no rubs, murmurs or gallops.   Respiratory: Clear to auscultation bilaterally with equal chest rise  Abdomen: obese, Soft, nontender, nondistended, + bowel sounds  Extremities: warm dry without cyanosis clubbing or edema.  Neuro: Patient unable to follow commands.   Data Reviewed: Basic Metabolic Panel:  Recent Labs Lab 10-01-14 1050  NA 137  K 5.1  CL 95*  CO2 34*  GLUCOSE 209*  BUN 6  CREATININE 0.54  CALCIUM 9.0   Liver Function Tests:  Recent Labs Lab 10-01-2014 1050  AST 61*  ALT 59*  ALKPHOS 105  BILITOT 0.6  PROT 8.1  ALBUMIN 3.2*    Recent Labs Lab 2014-10-01 1050  AMMONIA 52   CBC:  Recent Labs Lab 2014-10-01 1050  WBC 12.6*  NEUTROABS 11.0*  HGB 14.1  HCT 44.7  MCV 100.2*  PLT 160   BNP (last 3 results)  Recent Labs  09/11/14 1418  PROBNP 64.9   CBG:  Recent Labs Lab 10/01/2014 1704 October 01, 2014 2322 09/28/14 0802 09/28/14 1145  GLUCAP 250* 161* 127* 211*    Recent Results (from the past 240 hour(s))  Blood Culture (routine x 2)     Status: None (Preliminary result)   Collection  Time: 10/01/14 10:55 AM  Result Value Ref Range Status   Specimen Description BLOOD LEFT ARM  Final   Special Requests BOTTLES DRAWN AEROBIC AND ANAEROBIC 10 CC  Final   Culture  Setup Time   Final    October 01, 2014 20:16 Performed at [facility]    Culture   Final           BLOOD CULTURE RECEIVED NO GROWTH TO DATE CULTURE WILL BE HELD FOR 5 DAYS BEFORE ISSUING A FINAL NEGATIVE REPORT Performed at [facility]    Report Status PENDING  Incomplete  Blood Culture (routine x 2)     Status: None (Preliminary result)   Collection Time: October 01, 2014  3:34 PM  Result Value Ref Range Status   Specimen Description BLOOD RIGHT ARM  Final   Special Requests BOTTLES DRAWN AEROBIC AND ANAEROBIC 10CC  Final   Culture  Setup Time  Final    2014/01/05 23:45 [facility]formed at [facility]    Culture   Final           BLOOD CULTURE RECEIVED NO GROWTH TO DATE CULTURE WILL BE HELD FOR 5 DAYS BEFORE ISSUING A FINAL NEGATIVE REPORT Performed at [facility]    Report Status PENDING  Incomplete     Studies: Dg Chest Port 1 View  2014/01/05   CLINICAL DATA:  Altered mental status. Patient was unable to follow breathing instructions. Cough since this morning.  EXAM: PORTABLE CHEST - 1 VIEW  COMPARISON:  09/13/2014  FINDINGS: Patient is rotated towards the left. Densities at the left lung base are concerning for volume loss or airspace disease. Overall, low lung volumes. No focal disease in the right lung. Limited evaluation of the cardiac silhouette. Negative for a large pneumothorax.  IMPRESSION: Concern for densities in the left lower lung. Differential diagnosis includes atelectasis versus airspace disease. Limited exam due to patient positioning.   Electronically Signed   By: Richarda OverlieAdam  Henn M.D.   On: 2014/01/05 10:44    Scheduled Meds: . antiseptic oral rinse  7 mL Mouth Rinse BID  . carbamazepine  300 mg Oral TID  . enoxaparin (LOVENOX) injection  40 mg Subcutaneous Q24H  .  insulin aspart  0-9 Units Subcutaneous 6 times per day  . lactulose  300 mL Rectal Daily  . levETIRAcetam  500 mg Intravenous Q12H  . metoprolol  5 mg Intravenous 4 times per day  . pantoprazole (PROTONIX) IV  40 mg Intravenous Q24H  . piperacillin-tazobactam (ZOSYN)  IV  3.375 g Intravenous 3 times per day  . vancomycin  1,000 mg Intravenous Once  . vancomycin  1,000 mg Intravenous Q8H   Continuous Infusions:   Principal Problem:   Severe sepsis Active Problems:   Atrial fibrillation   Congestive heart failure   GERD (gastroesophageal reflux disease)   Intellectual disability   Obstructive sleep apnea   HCAP (healthcare-associated pneumonia)   Chronic respiratory failure   DM type 2 (diabetes mellitus, type 2)   Seizure disorder   Increased ammonia level   Transaminitis   OSA on CPAP   Diabetes type 2, controlled   Urinary tract infection    Conley CanalYork, Osha Errico L, PA-C  [facility] Pager [phone]. If 7PM-7AM, please contact night-coverage at [email or web address], password [email or web address] 09/28/2014, 12:49 PM  LOS: 1 day

## 2014-09-28 NOTE — Procedures (Signed)
ELECTROENCEPHALOGRAM REPORT  Patient: Tony Terry       Room #: [id number] EEG No. ID: [id number] Age: 58 y.o.        Sex: male Referring Physician: Waymon AmatoHONGALGI, A Report Date:  09/28/2014        Interpreting Physician: Aline BrochureSTEWART,Corynne Scibilia R  History: Tony Terry is an 58 y.o. male with a history of seizure disorder admitted with altered mental status with fever and hypoxia as well as possible sepsis. Patient had an episode of focal twitching following admission. Etiology is unclear.  Indications for study:  Assess severity of encephalopathy; rule out seizure activity.  Technique: This is an 18 channel routine scalp EEG performed at the bedside with bipolar and monopolar montages arranged in accordance to the international 10/20 system of electrode placement.   Description: Background activity consisted of moderate generalized slowing with low to moderate amplitude diffuse mixed delta and theta activity. There were frequent occurrences of phase reversing sharp wave and spike discharges recorded from the left hemisphere as well as generalized sharp wave and spike discharges. Photic stimulation and hyperventilation were not performed.  Interpretation: This EEG is abnormal with moderate generalized nonspecific continuous slowing of cerebral activity as well as a left hemispheric epileptogenic disturbance of cerebral activity as well as generalized epileptiform discharges.   Venetia MaxonR Shameka Aggarwal M.D. [facility] [phone]

## 2014-09-28 NOTE — Progress Notes (Signed)
EEG completed, results pending. 

## 2014-09-28 NOTE — Progress Notes (Signed)
Increased pressure to 12cmH2O with 5L of O2 bleed in due to desaturation into the mid 70's mid 80's. Sats are maintaining between 92 and 93% at this time.

## 2014-09-28 NOTE — Evaluation (Addendum)
Clinical/Bedside Swallow Evaluation Patient Details  Name: Carlene CoriaMark Tidwell MRN: [id number] Date of Birth: 12/01/1955  Today's Date: 09/28/2014 Time: [id number] SLP Time Calculation (min) (ACUTE ONLY): 25 min  Past Medical History:  Past Medical History  Diagnosis Date  . Unspecified intellectual disabilities   . Excitative type psychosis   . CHF (congestive heart failure)   . Chronic respiratory failure   . Atrial fibrillation   . Obstructive sleep apnea (adult) (pediatric)   . Diabetes mellitus without complication   . Edema   . Hyperlipidemia   . Hypertension   . Hypopotassemia   . Seizures   . GERD (gastroesophageal reflux disease)   . Unspecified constipation   . Obesity   . Femoral neck fracture    Past Surgical History: History reviewed. No pertinent past surgical history. HPI:  58 y.o. male with a history of intellectual disability, congestive heart failure, COPD, GERD, obesity, chronic respiratory failure, atrial fibrillation, obstructive sleep apnea, diabetes mellitus [facility]  Chest x-ray showed questionable pneumonia, concern for densities in the left lower lung. Differential diagnosis includes atelectasis versus airspace disea. Of note, patient was recently admitted for hospital-acquired pneumonia approximately one week ago and found to have a urinary tract infection.  BSE 12/5 with s/s aspiration with large sip and pt lethargic. Dys 3 and small sips thin recommended ( Additional recommendations included: to control sip size and rate, recommend that all liquids be consumed by straw with caregiver holding cup and straw so that it can be removed from the oral cavity between sips. SLP provided education regarding this recommendation to family (brother, and sister in law) and RN during the session. Continue dysphagia 3 (mechanical soft) diet with thin liquids. No further speech treatment is needed, SLP signing off.) MBS 2006 -results  unkown.   Assessment / Plan / Recommendation Clinical Impression  Pt. seen for bedside swallow assessment with brother and sister-in-law present.  SLP reviewed and discussed prior dysphagia history with assessments and recommendations. Oral cavity cleaned and total verbal/visual/tactile stimuli provided with minimal success to maintain alertness. Frequent coughing noted with saliva. No vocalizations present, subtle attempts to follow commands, however unable to stay awake. Not appropriate to introduce/attempt po's at this time.  SLP will continue efforts.     Aspiration Risk  Severe    Diet Recommendation NPO        Other  Recommendations Oral Care Recommendations: Oral care BID   Follow Up Recommendations  Skilled Nursing facility    Frequency and Duration min 2x/week  2 weeks   Pertinent Vitals/Pain No evidence pain         Swallow Study          Oral/Motor/Sensory Function Overall Oral Motor/Sensory Function:  (no attempts with verbal cues, lethargic )   Ice Chips Ice chips: Not tested   Thin Liquid Thin Liquid: Not tested    Nectar Thick Nectar Thick Liquid: Not tested   Honey Thick Honey Thick Liquid: Not tested   Puree Puree: Not tested   Solid   GO    Solid: Not tested       Royce MacadamiaLitaker, Renner Sebald Willis 09/28/2014,3:15 PM  Breck CoonsLisa Willis Lonell FaceLitaker M.Ed [employer] Pager [phone]

## 2014-09-29 ENCOUNTER — Inpatient Hospital Stay (HOSPITAL_COMMUNITY): Payer: Medicare Other

## 2014-09-29 DIAGNOSIS — N3000 Acute cystitis without hematuria: Secondary | ICD-10-CM

## 2014-09-29 LAB — COMPREHENSIVE METABOLIC PANEL
ALK PHOS: 73 U/L (ref 39–117)
ALT: 36 U/L (ref 0–53)
ANION GAP: 2 — AB (ref 5–15)
AST: 35 U/L (ref 0–37)
Albumin: 2.4 g/dL — ABNORMAL LOW (ref 3.5–5.2)
BUN: 5 mg/dL — AB (ref 6–23)
CALCIUM: 7.9 mg/dL — AB (ref 8.4–10.5)
CO2: 36 mmol/L — AB (ref 19–32)
Chloride: 97 mEq/L (ref 96–112)
Creatinine, Ser: 0.64 mg/dL (ref 0.50–1.35)
GLUCOSE: 161 mg/dL — AB (ref 70–99)
Potassium: 4.3 mmol/L (ref 3.5–5.1)
SODIUM: 135 mmol/L (ref 135–145)
Total Bilirubin: 0.7 mg/dL (ref 0.3–1.2)
Total Protein: 6.7 g/dL (ref 6.0–8.3)

## 2014-09-29 LAB — LEGIONELLA ANTIGEN, URINE

## 2014-09-29 LAB — GLUCOSE, CAPILLARY
GLUCOSE-CAPILLARY: 128 mg/dL — AB (ref 70–99)
GLUCOSE-CAPILLARY: 126 mg/dL — AB (ref 70–99)
Glucose-Capillary: 144 mg/dL — ABNORMAL HIGH (ref 70–99)
Glucose-Capillary: 149 mg/dL — ABNORMAL HIGH (ref 70–99)
Glucose-Capillary: 153 mg/dL — ABNORMAL HIGH (ref 70–99)
Glucose-Capillary: 151 mg/dL — ABNORMAL HIGH (ref 70–99)
Glucose-Capillary: 143 mg/dL — ABNORMAL HIGH (ref 70–99)

## 2014-09-29 LAB — CBC
HCT: 37.2 % — ABNORMAL LOW (ref 39.0–52.0)
Hemoglobin: 11.9 g/dL — ABNORMAL LOW (ref 13.0–17.0)
MCH: 32.4 pg (ref 26.0–34.0)
MCHC: 32 g/dL (ref 30.0–36.0)
MCV: 101.4 fL — ABNORMAL HIGH (ref 78.0–100.0)
PLATELETS: 113 10*3/uL — AB (ref 150–400)
RBC: 3.67 MIL/uL — ABNORMAL LOW (ref 4.22–5.81)
RDW: 13.7 % (ref 11.5–15.5)
WBC: 11.4 10*3/uL — ABNORMAL HIGH (ref 4.0–10.5)

## 2014-09-29 LAB — INFLUENZA PANEL BY PCR (TYPE A & B)
H1N1 flu by pcr: NOT DETECTED
INFLAPCR: NEGATIVE
Influenza B By PCR: NEGATIVE

## 2014-09-29 LAB — AMMONIA: AMMONIA: 57 umol/L — AB (ref 11–32)

## 2014-09-29 LAB — HEPATITIS PANEL, ACUTE
HCV Ab: NEGATIVE
HEP B S AG: NEGATIVE
Hep A IgM: NONREACTIVE
Hep B C IgM: NONREACTIVE

## 2014-09-29 MED ORDER — JEVITY 1.2 CAL PO LIQD
1000.0000 mL | ORAL | Status: DC
  Administered 2014-09-29: 1000 mL
  Filled 2014-09-29: qty 1000

## 2014-09-29 MED ORDER — VANCOMYCIN HCL IN DEXTROSE 1-5 GM/200ML-% IV SOLN
1000.0000 mg | Freq: Three times a day (TID) | INTRAVENOUS | Status: DC
  Administered 2014-09-29 – 2014-09-30 (×3): 1000 mg via INTRAVENOUS
  Filled 2014-09-29 (×8): qty 200

## 2014-09-29 MED ORDER — LEVETIRACETAM IN NACL 1000 MG/100ML IV SOLN
1000.0000 mg | Freq: Once | INTRAVENOUS | Status: AC
  Administered 2014-09-29: 1000 mg via INTRAVENOUS
  Filled 2014-09-29: qty 100

## 2014-09-29 MED ORDER — PRIMIDONE 50 MG PO TABS
200.0000 mg | ORAL_TABLET | Freq: Three times a day (TID) | ORAL | Status: DC
  Administered 2014-09-29 – 2014-09-30 (×3): 200 mg
  Filled 2014-09-29 (×8): qty 4

## 2014-09-29 MED ORDER — CARBAMAZEPINE 100 MG/5ML PO SUSP
300.0000 mg | Freq: Three times a day (TID) | ORAL | Status: DC
  Administered 2014-09-29 – 2014-09-30 (×3): 300 mg
  Filled 2014-09-29 (×7): qty 15

## 2014-09-29 MED ORDER — PRO-STAT SUGAR FREE PO LIQD
30.0000 mL | Freq: Two times a day (BID) | ORAL | Status: DC
  Administered 2014-09-29 – 2014-09-30 (×2): 30 mL
  Filled 2014-09-29 (×3): qty 30

## 2014-09-29 MED ORDER — LEVETIRACETAM IN NACL 1000 MG/100ML IV SOLN
1000.0000 mg | Freq: Two times a day (BID) | INTRAVENOUS | Status: DC
  Administered 2014-09-29 – 2014-09-30 (×2): 1000 mg via INTRAVENOUS
  Filled 2014-09-29 (×4): qty 100

## 2014-09-29 MED ORDER — FUROSEMIDE 10 MG/ML IJ SOLN
40.0000 mg | Freq: Once | INTRAMUSCULAR | Status: AC
  Administered 2014-09-29: 40 mg via INTRAVENOUS
  Filled 2014-09-29: qty 4

## 2014-09-29 NOTE — Progress Notes (Signed)
PROGRESS NOTE  Tony Terry ZOX:[id number] DOB: [birth year] DOA: 09/26/2014 PCP: Angela Cox, MD  Javis Abboud is a 58 y.o. male with a history of diastolic heart failure, chronic respiratory failure, atrial fibrillation, obstructive sleep apnea, diabetes mellitus, that presented to the emergency department with complaints of altered mental status.  Patient was somewhat lethargic and more confused as compared to his baseline. Upon arrival to the emergency department, patient was found to be febrile with hypoxia. He was found to have a urinary tract infection in the emergency department.  Of note,  Mr. Zerby, was admitted for hospital-acquired pneumonia one week ago.  He was also found to have elevated ammonia at his prior hospitalization however ammonia level was normal upon admission.    Assessment/Plan:  Severe sepsis secondary to urinary tract infection versus possible HCAP Vancomycin and Zosyn per pharmacy.  UA: WBC 21-50, large leukocytes.  Culture show 70k colonies of gm neg rods. Bld cx show NGTD.  HCAP Evidenced on CXR 12/20 and 12/22 LLL infiltrate.  Continue with Vanc and Zosyn Blood cultures show NGTD, sputum culture not yet obtained, urine legionella and strep pneumonia antigens are both negative.  Acute encephalopathy Less lethargic on my exam this morning, but speech therapy tells me they had a difficult time waking him later in the morning.  Encephalopathy likely due to infection, but we are concerned for possible seizure as well.  Per speech continue NPO.  Will need to place n/g for medications and nutrition.  Neurology on board.    Ammonia level normal at the time of admission.  Will continue lactulose PR or per tube.  Seizure disorder Patient with myoclonic jerks and facial twitching.  Neurology consulted.  EEG abnormal with epileptiform discharges.  Keppa IV started 12/21 (as patient is NPO and currently unable to take primidone or tegretol).  Placing N/G in  order to restart primidone and tegretol as well as nutrition.  Chronic hypoxic respiratory failure/obstructive sleep apnea Patient's abg on 12/22 is improved slightly despite his clinical appearance.  Oxygen sats are maintaining well on supplemental oxygen.  CPAP QHS.  Chronic diastolic heart failure Last echocardiogram 09/12/2014 showed an EF of 60-65%, Worsening CHF noted on xray 12/22.  Will give IV lasix.  Stop IVF.    BB continued IV.  Once NG is placed will change to Per Tube.  Diabetes mellitus, type II Insulin SSI q 4 hours while NPO. Last hemoglobin A1c on 09/11/2014: 6.0  Mild nonobstructive transaminitis Transaminases have normalized.  Acute hep panel negative.   Will monitor transaminases after restarting tegretol.  Ammonia level was previously elevated.  Currently normal, will continue lactulose.  Chronic atrial fibrillation Currently rate and rhythm controlled, and sinus rhythm Continue aspirin therapy when able to take POs. Patient is not on any anticoagulation, CHADs score 3  Left fifth metatarsal wound Wound care will be consulted, patient will be placed on broad-spectrum antibiotics  Intellectual disability Currently complicated by acute encephalopathy Per brother, patient has functioning ability of a 27-year-old   DVT Prophylaxis:  lovenox  Code Status: DNR Family Communication: Spoke with patient's sister on the phone.  She and her brother in [place] are medical POA (Tony Terry and Tony Terry) Disposition Plan: Inpatient   Consultants: Neuro  Procedures: EEG 12/21:   Interpretation: This EEG is abnormal with moderate generalized nonspecific continuous slowing of cerebral activity as well as a left hemispheric epileptogenic disturbance of cerebral activity as well as generalized epileptiform discharges.  Antibiotics: Anti-infectives  Start     Dose/Rate Route Frequency Ordered Stop   10-Nov-2013 2000  piperacillin-tazobactam (ZOSYN) IVPB 3.375 g     3.375 g12.5  mL/hr over 240 Minutes Intravenous 3 times per day 10-Nov-2013 1051     10-Nov-2013 1900  vancomycin (VANCOCIN) IVPB 1000 mg/200 mL premix  Status:  Discontinued     1,000 mg200 mL/hr over 60 Minutes Intravenous Every 8 hours 10-Nov-2013 1051 09/29/14 0947   10-Nov-2013 1045  vancomycin (VANCOCIN) IVPB 1000 mg/200 mL premix  Status:  Discontinued     1,000 mg200 mL/hr over 60 Minutes Intravenous  Once 10-Nov-2013 1036 09/28/14 1558   10-Nov-2013 1030  piperacillin-tazobactam (ZOSYN) IVPB 3.375 g     3.375 g100 mL/hr over 30 Minutes Intravenous  Once 10-Nov-2013 1017 10-Nov-2013 1445   10-Nov-2013 1030  vancomycin (VANCOCIN) IVPB 1000 mg/200 mL premix     1,000 mg200 mL/hr over 60 Minutes Intravenous  Once 10-Nov-2013 1017 10-Nov-2013 1445        HPI/Subjective: Unable to understand patients speech..  Objective: Filed Vitals:   09/28/14 2216 09/29/14 0003 09/29/14 0500 09/29/14 0600  BP:  128/62  131/58  Pulse: 109 93  96  Temp:    98.8 F (37.1 C)  TempSrc:    Oral  Resp: 18   15  Height:      Weight:   116.6 kg (257 lb 0.9 oz)   SpO2: 92% 93%  93%    Intake/Output Summary (Last 24 hours) at 09/29/14 1243 Last data filed at 09/29/14 0959  Gross per 24 hour  Intake    600 ml  Output      0 ml  Net    600 ml   Filed Weights   10-Nov-2013 1501 09/28/14 0620 09/29/14 0500  Weight: 116.2 kg (256 lb 2.8 oz) 117.2 kg (258 lb 6.1 oz) 116.6 kg (257 lb 0.9 oz)    Exam: General: Obese male, eyes open, on CPAP.  Attempts to speak. HEENT:  PERR, scelera clear. MMM Neck: Thick, Supple, no JVD, no masses  Cardiovascular: RRR, S1 S2 auscultated, no rubs, murmurs or gallops.   Respiratory: Clear to auscultation bilaterally with equal chest rise  Abdomen: obese, Soft, nontender, nondistended, + bowel sounds  Extremities: warm dry without cyanosis clubbing or edema.  Psych:  Patient will intellectual disabilities, but calm.     Data Reviewed: Basic Metabolic Panel:  Recent Labs Lab 10-Nov-2013 1050 09/29/14 0530    NA 137 135  K 5.1 4.3  CL 95* 97  CO2 34* 36*  GLUCOSE 209* 161*  BUN 6 5*  CREATININE 0.54 0.64  CALCIUM 9.0 7.9*   Liver Function Tests:  Recent Labs Lab 10-Nov-2013 1050 09/29/14 0530  AST 61* 35  ALT 59* 36  ALKPHOS 105 73  BILITOT 0.6 0.7  PROT 8.1 6.7  ALBUMIN 3.2* 2.4*    Recent Labs Lab 10-Nov-2013 1050 09/29/14 0530  AMMONIA 52 57*   CBC:  Recent Labs Lab 10-Nov-2013 1050 09/29/14 0530  WBC 12.6* 11.4*  NEUTROABS 11.0*  --   HGB 14.1 11.9*  HCT 44.7 37.2*  MCV 100.2* 101.4*  PLT 160 113*   BNP (last 3 results)  Recent Labs  09/11/14 1418  PROBNP 64.9   CBG:  Recent Labs Lab 09/28/14 2108 09/29/14 0152 09/29/14 0544 09/29/14 0804 09/29/14 1204  GLUCAP 129* 144* 149* 128* 126*    Recent Results (from the past 240 hour(s))  Blood Culture (routine x 2)     Status: None (  Preliminary result)   Collection Time: 09/09/2014 10:55 AM  Result Value Ref Range Status   Specimen Description BLOOD LEFT ARM  Final   Special Requests BOTTLES DRAWN AEROBIC AND ANAEROBIC 10 CC  Final   Culture  Setup Time   Final    09/09/2014 20:16 Performed at [facility]    Culture   Final           BLOOD CULTURE RECEIVED NO GROWTH TO DATE CULTURE WILL BE HELD FOR 5 DAYS BEFORE ISSUING A FINAL NEGATIVE REPORT Performed at [facility]    Report Status PENDING  Incomplete  Urine culture     Status: None (Preliminary result)   Collection Time: 09/11/2014 11:34 AM  Result Value Ref Range Status   Specimen Description URINE, CLEAN CATCH  Final   Special Requests NONE  Final   Culture  Setup Time   Final    09/15/2014 21:30 Performed at [facility]    Colony Count   Final    70,000 COLONIES/ML Performed at [facility]    Culture   Final    GRAM NEGATIVE RODS Performed at [facility]    Report Status PENDING  Incomplete  Blood Culture (routine x 2)     Status: None (Preliminary result)   Collection Time: 09/29/2014  3:34  PM  Result Value Ref Range Status   Specimen Description BLOOD RIGHT ARM  Final   Special Requests BOTTLES DRAWN AEROBIC AND ANAEROBIC 10CC  Final   Culture  Setup Time   Final    09/11/2014 23:45 Performed at [facility]    Culture   Final           BLOOD CULTURE RECEIVED NO GROWTH TO DATE CULTURE WILL BE HELD FOR 5 DAYS BEFORE ISSUING A FINAL NEGATIVE REPORT Performed at [facility]    Report Status PENDING  Incomplete  Urine culture     Status: None (Preliminary result)   Collection Time: 10/02/2014  5:41 PM  Result Value Ref Range Status   Specimen Description URINE, CATHETERIZED  Final   Special Requests NONE  Final   Culture  Setup Time   Final    09/28/2014 04:07 Performed at [facility]    Colony Count PENDING  Incomplete   Culture   Final    Culture reincubated for better growth Performed at [facility]    Report Status PENDING  Incomplete     Studies: Dg Chest Port 1 View  10/07/2014   CLINICAL DATA:  Altered mental status. Patient was unable to follow breathing instructions. Cough since this morning.  EXAM: PORTABLE CHEST - 1 VIEW  COMPARISON:  09/13/2014  FINDINGS: Patient is rotated towards the left. Densities at the left lung base are concerning for volume loss or airspace disease. Overall, low lung volumes. No focal disease in the right lung. Limited evaluation of the cardiac silhouette. Negative for a large pneumothorax.  IMPRESSION: Concern for densities in the left lower lung. Differential diagnosis includes atelectasis versus airspace disease. Limited exam due to patient positioning.   Electronically Signed   By: Richarda OverlieAdam  Henn M.D.   On: 10/06/2014 10:44    Scheduled Meds: . antiseptic oral rinse  7 mL Mouth Rinse BID  . enoxaparin (LOVENOX) injection  40 mg Subcutaneous Q24H  . insulin aspart  0-9 Units Subcutaneous 6 times per day  . lactulose  300 mL Rectal Daily  . levETIRAcetam  1,000 mg Intravenous Q12H  .  metoprolol  5  mg Intravenous 4 times per day  . pantoprazole (PROTONIX) IV  40 mg Intravenous Q24H  . piperacillin-tazobactam (ZOSYN)  IV  3.375 g Intravenous 3 times per day   Continuous Infusions:   Principal Problem:   Severe sepsis Active Problems:   Seizures   OSA on CPAP   Atrial fibrillation   Congestive heart failure   GERD (gastroesophageal reflux disease)   Intellectual disability   Obstructive sleep apnea   HCAP (healthcare-associated pneumonia)   Chronic respiratory failure   DM type 2 (diabetes mellitus, type 2)   Seizure disorder   Increased ammonia level   Transaminitis   Diabetes type 2, controlled   Urinary tract infection    Conley Canal  [facility] Pager [phone]. If 7PM-7AM, please contact night-coverage at [email or web address], password [facility] 09/29/2014, 12:43 PM  LOS: 2 days

## 2014-09-29 NOTE — Progress Notes (Signed)
Subjective: Continues to have intermittent left facial twitching.   Exam: Filed Vitals:   09/29/14 0600  BP: 131/58  Pulse: 96  Temp: 98.8 F (37.1 C)  Resp: 15   Gen: In bed, NAD MS: awake, tracks, does not speak. Follows commands to wiggle toes, squeeze hands.  JW:JXBJYCN:PERRL, blinks to threat bilaterally, left facial twitching.  Motor: follows commands to wiggle toes bilateraly and squeezes hands bilaterally.    Impression: 58 yo M With recurrent focal left sided motor seizures. He is on keppra as he was not able to take his PO meds yesterday.   Recommendations: 1)continue keppra 1 gm BID after 1gm additional dose 2) restart home tegretol and primidone. May need NG tube if unable to swallow.  3) will continue to follow.     Ritta SlotMcNeill Evalyne Cortopassi, MD [facility] [phone]  If 7pm- 7am, please page neurology on call as listed in AMION.

## 2014-09-29 NOTE — Progress Notes (Signed)
INITIAL NUTRITION ASSESSMENT  DOCUMENTATION CODES Per approved criteria  -Morbid Obesity   INTERVENTION: 1.  Enteral nutrition; initiate Jevity 1.2 @ 20 mL/hr continuous.  Advance by 10 mL q 4 hrs to 65 mL/hr goal with Prostat BID to provide 2072 kcal, 116g protein, 1279 mL free water.  NUTRITION DIAGNOSIS: Inadequate oral intake related to inability to eat as evidenced by SLP recommendation for NPO.   Monitor:  1.  Enteral nutrition; initiation with tolerance.  Pt to meet >/=90% estimated needs with nutrition support.  2.  Wt/wt change; monitor trends  Reason for Assessment: consult  58 y.o. male  Admitting Dx: Severe sepsis  ASSESSMENT: Pt admitted with AMS from home facility.  Patient with hx of intellectual disability, CHF, COPD, GERD, obesity, DM, a fib, OSA, and chronic respiratory failure.   Patient found to have focal left-sided motor seizures.  Assessed by SLP who determined patient not appropriate for PO intake at this time. Neuro recommends NGT for meds.  RD consulted for TF initiation while NPO.   RN in with patient during visit; unable to perform nutrition-focused physical exam.  RN confirms patient has NGT in place.  RD to place orders.   RD to follow.    Height: Ht Readings from Last 1 Encounters:  10/05/2014 5\' 7"  (1.702 m)    Weight: Wt Readings from Last 1 Encounters:  09/29/14 257 lb 0.9 oz (116.6 kg)    Ideal Body Weight: 67.3 kg  % Ideal Body Weight: 172%  Wt Readings from Last 10 Encounters:  09/29/14 257 lb 0.9 oz (116.6 kg)  09/22/14 252 lb (114.306 kg)  09/18/14 248 lb 10.9 oz (112.8 kg)  08/10/14 264 lb 12.8 oz (120.112 kg)  03/26/14 267 lb 3.2 oz (121.201 kg)  01/28/14 264 lb 3.2 oz (119.84 kg)  12/10/13 266 lb 8 oz (120.884 kg)  06/17/13 255 lb (115.667 kg)  04/16/13 260 lb 12.8 oz (118.298 kg)    Usual Body Weight: 255-260 lbs  % Usual Body Weight: 100%  BMI:  Body mass index is 40.25 kg/(m^2).  Estimated Nutritional  Needs: Kcal: 1900-2100 Protein: 105-120g Fluid: >2.1 L/day  Skin: intact  Diet Order: Diet NPO time specified  EDUCATION NEEDS: -No education needs identified at this time   Intake/Output Summary (Last 24 hours) at 09/29/14 1603 Last data filed at 09/29/14 1449  Gross per 24 hour  Intake    600 ml  Output      0 ml  Net    600 ml    Last BM: 12/21   Labs:   Recent Labs Lab 09/24/2014 1050 09/29/14 0530  NA 137 135  K 5.1 4.3  CL 95* 97  CO2 34* 36*  BUN 6 5*  CREATININE 0.54 0.64  CALCIUM 9.0 7.9*  GLUCOSE 209* 161*    CBG (last 3)   Recent Labs  09/29/14 0544 09/29/14 0804 09/29/14 1204  GLUCAP 149* 128* 126*    Scheduled Meds: . antiseptic oral rinse  7 mL Mouth Rinse BID  . carBAMazepine  300 mg Per Tube TID  . enoxaparin (LOVENOX) injection  40 mg Subcutaneous Q24H  . insulin aspart  0-9 Units Subcutaneous 6 times per day  . lactulose  300 mL Rectal Daily  . levETIRAcetam  1,000 mg Intravenous Q12H  . metoprolol  5 mg Intravenous 4 times per day  . pantoprazole (PROTONIX) IV  40 mg Intravenous Q24H  . piperacillin-tazobactam (ZOSYN)  IV  3.375 g Intravenous 3 times per day  .  primidone  200 mg Per Tube 3 times per day  . vancomycin  1,000 mg Intravenous Q8H    Continuous Infusions:   Past Medical History  Diagnosis Date  . Unspecified intellectual disabilities   . Excitative type psychosis   . CHF (congestive heart failure)   . Chronic respiratory failure   . Atrial fibrillation   . Obstructive sleep apnea (adult) (pediatric)   . Diabetes mellitus without complication   . Edema   . Hyperlipidemia   . Hypertension   . Hypopotassemia   . Seizures   . GERD (gastroesophageal reflux disease)   . Unspecified constipation   . Obesity   . Femoral neck fracture     History reviewed. No pertinent past surgical history.  Loyce DysKacie Rozanne Heumann, MS RD LDN Clinical Inpatient Dietitian Weekend/After hours pager: [phone]

## 2014-09-29 NOTE — Progress Notes (Signed)
Urine culture showed gram neg rods. RN called pharmacist to confirm that current antibiotics were appropriate treatment. Pharmacist confirmed pt on appropriate coverage. Will continue to monitor.

## 2014-09-29 NOTE — Progress Notes (Signed)
Speech Language Pathology Treatment: Dysphagia  Patient Details Name: Tony CoriaMark Terry MRN: [id number] DOB: [birth year] Today's Date: 09/29/2014 Time: [id number] SLP Time Calculation (min) (ACUTE ONLY): 20 min  Assessment / Plan / Recommendation Clinical Impression  Tony Terry. continues to exhibit lethargy and maintained eye opening for 45 seconds following multimodal cues.  Tony Terry. unable to follow commands and SLP unable to elicit vocalizations.  Tony Terry initiated swallows with trials thin and applesauce with adequate oral transit and pharyngeal swallow with what appeared to be a timely swallow; swallows were intermittently audible that may represent structural abnormalities or discoordination.  Despite his ability to initiate and execute swallows, Tony Terry is at significantly high aspiration risk (silent) given history of dysphagia,episodes of lethargy (was more alert this am with PA-C), inability to assess vocal quality and decreased overall awareness/command following. Recommend short term temporary means of nutrition and continued ST.   HPI HPI: 58 y.o. male with a history of intellectual disability, congestive heart failure, COPD, GERD, obesity, chronic respiratory failure, atrial fibrillation, obstructive sleep apnea, diabetes mellitus admitted with altered mental status [facility].  Chest x-ray showed questionable pneumonia, concern for densities in the left lower lung. Differential diagnosis includes atelectasis versus airspace disea. Of note, patient was recently admitted for hospital-acquired pneumonia approximately one week ago and found to have a urinary tract infection.  BSE 12/5 with s/s aspiration with large sip and Tony Terry lethargic. Dys 3 and small sips thin recommended ( Additional recommendations included: to control sip size and rate, recommend that all liquids be consumed by straw with caregiver holding cup and straw so that it can be removed from the oral cavity between sips. SLP provided education  regarding this recommendation to family (brother, and sister in law) and RN during the session. Continue dysphagia 3 (mechanical soft) diet with thin liquids. No further speech treatment is needed, SLP signing off.) MBS 2006 -results unkown.   Pertinent Vitals Pain Assessment:  (no evidence)  SLP Plan  Continue with current plan of care    Recommendations Diet recommendations: NPO Medication Administration: Via alternative means              Oral Care Recommendations: Oral care BID Follow up Recommendations: Skilled Nursing facility Plan: Continue with current plan of care    GO     Royce MacadamiaLitaker, Dnya Hickle Willis 09/29/2014, 1:33 PM   Breck CoonsLisa Willis Lonell FaceLitaker M.Ed [employer] Pager [phone]

## 2014-09-30 ENCOUNTER — Inpatient Hospital Stay (HOSPITAL_COMMUNITY): Payer: Medicare Other

## 2014-09-30 DIAGNOSIS — R569 Unspecified convulsions: Secondary | ICD-10-CM

## 2014-09-30 LAB — CBC
HEMATOCRIT: 40.9 % (ref 39.0–52.0)
Hemoglobin: 12.6 g/dL — ABNORMAL LOW (ref 13.0–17.0)
MCH: 31.2 pg (ref 26.0–34.0)
MCHC: 30.8 g/dL (ref 30.0–36.0)
MCV: 101.2 fL — ABNORMAL HIGH (ref 78.0–100.0)
Platelets: 115 10*3/uL — ABNORMAL LOW (ref 150–400)
RBC: 4.04 MIL/uL — ABNORMAL LOW (ref 4.22–5.81)
RDW: 13.7 % (ref 11.5–15.5)
WBC: 7.5 10*3/uL (ref 4.0–10.5)

## 2014-09-30 LAB — BASIC METABOLIC PANEL
Anion gap: 10 (ref 5–15)
BUN: 9 mg/dL (ref 6–23)
CO2: 34 mmol/L — ABNORMAL HIGH (ref 19–32)
CREATININE: 0.68 mg/dL (ref 0.50–1.35)
Calcium: 8.2 mg/dL — ABNORMAL LOW (ref 8.4–10.5)
Chloride: 89 mEq/L — ABNORMAL LOW (ref 96–112)
Glucose, Bld: 178 mg/dL — ABNORMAL HIGH (ref 70–99)
POTASSIUM: 3.9 mmol/L (ref 3.5–5.1)
Sodium: 133 mmol/L — ABNORMAL LOW (ref 135–145)

## 2014-09-30 LAB — URINE CULTURE: Colony Count: 70000

## 2014-09-30 LAB — GLUCOSE, CAPILLARY
GLUCOSE-CAPILLARY: 170 mg/dL — AB (ref 70–99)
Glucose-Capillary: 191 mg/dL — ABNORMAL HIGH (ref 70–99)
Glucose-Capillary: 194 mg/dL — ABNORMAL HIGH (ref 70–99)
Glucose-Capillary: 252 mg/dL — ABNORMAL HIGH (ref 70–99)

## 2014-09-30 MED ORDER — METOPROLOL TARTRATE 25 MG/10 ML ORAL SUSPENSION
75.0000 mg | Freq: Two times a day (BID) | ORAL | Status: DC
  Filled 2014-09-30: qty 30

## 2014-09-30 MED ORDER — DILTIAZEM 12 MG/ML ORAL SUSPENSION: 60.0000 mg | Freq: Two times a day (BID) | ORAL | Status: DC

## 2014-09-30 MED ORDER — PANTOPRAZOLE SODIUM 40 MG PO PACK: 40.0000 mg | PACK | Freq: Every day | ORAL | Status: DC

## 2014-09-30 MED ORDER — SODIUM CHLORIDE 0.9 % IV SOLN
500.0000 mg | Freq: Four times a day (QID) | INTRAVENOUS | Status: DC
  Administered 2014-09-30: 500 mg via INTRAVENOUS
  Filled 2014-09-30 (×4): qty 500

## 2014-09-30 MED ORDER — METOPROLOL TARTRATE 50 MG PO TABS
75.0000 mg | ORAL_TABLET | Freq: Two times a day (BID) | ORAL | Status: DC
  Administered 2014-09-30: 75 mg via ORAL
  Filled 2014-09-30 (×2): qty 1

## 2014-09-30 MED ORDER — METOPROLOL TARTRATE 50 MG PO TABS
75.0000 mg | ORAL_TABLET | Freq: Two times a day (BID) | ORAL | Status: DC
  Filled 2014-09-30: qty 1

## 2014-09-30 MED ORDER — FUROSEMIDE 10 MG/ML IJ SOLN
60.0000 mg | Freq: Two times a day (BID) | INTRAMUSCULAR | Status: DC
  Administered 2014-09-30: 60 mg via INTRAVENOUS
  Filled 2014-09-30: qty 6

## 2014-09-30 MED ORDER — LORAZEPAM 2 MG/ML IJ SOLN: 0.5000 mg | Freq: Once | INTRAMUSCULAR | Status: DC | PRN

## 2014-09-30 MED ORDER — DILTIAZEM HCL 60 MG PO TABS
120.0000 mg | ORAL_TABLET | Freq: Every day | ORAL | Status: DC
  Filled 2014-09-30: qty 2

## 2014-09-30 MED ORDER — LEVETIRACETAM IN NACL 500 MG/100ML IV SOLN
500.0000 mg | Freq: Two times a day (BID) | INTRAVENOUS | Status: DC
  Filled 2014-09-30 (×2): qty 100

## 2014-09-30 MED ORDER — DILTIAZEM HCL ER COATED BEADS 120 MG PO CP24
120.0000 mg | ORAL_CAPSULE | Freq: Every day | ORAL | Status: DC
  Administered 2014-09-30: 120 mg via ORAL
  Filled 2014-09-30: qty 1

## 2014-10-01 LAB — URINE CULTURE

## 2014-10-03 LAB — CULTURE, BLOOD (ROUTINE X 2)
CULTURE: NO GROWTH
Culture: NO GROWTH

## 2014-10-06 LAB — PRIMIDONE AND METABOLITE LEVEL
Phenobarbital: 11 mg/L — ABNORMAL LOW (ref 15.0–40.0)
Primidone, Serum: 6.8 ug/mL (ref 8–12)

## 2014-10-09 NOTE — Discharge Summary (Addendum)
Death Summary  Tony CoriaMark Karen Terry:[id number] DOB: 05/20/1956 DOA: Oct 31, 2013  PCP: Angela Coxasanayaka, Gayani Y, MD   Admit date: Oct 31, 2013 Date of Death: 09/23/2014  Final Diagnoses:  Principal Problem:   Severe sepsis Active Problems:   Atrial fibrillation   Congestive heart failure   Seizures   GERD (gastroesophageal reflux disease)   Intellectual disability   Obstructive sleep apnea   HCAP (healthcare-associated pneumonia)   Chronic respiratory failure   DM type 2 (diabetes mellitus, type 2)   Seizure disorder   Increased ammonia level   Transaminitis   OSA on CPAP   Diabetes type 2, controlled   Urinary tract infection      History of present illness:  Tony CoriaMark Terry is a 59 y.o. male with a history of intellectual disability and mind off a 59-year-old, diastolic heart failure, chronic respiratory failure, atrial fibrillation, obstructive sleep apnea, diabetes mellitus, that presented to the emergency department with complaints of altered mental status. Patient was somewhat lethargic and more confused as compared to his baseline. Upon arrival to the emergency department, patient was found to be febrile with hypoxia. He was found to have a urinary tract infection in the emergency department. Of note, Tony Terry, was admitted for hospital-acquired pneumonia one week ago. He was also found to have elevated ammonia at his prior hospitalization however ammonia level was normal upon admission.   Hospital Course:  Patient was admitted to telemetry and started empirically on IV vancomycin and Zosyn for presumed urinary tract infection and healthcare associated pneumonia. His urine cultures showed ESBL Escherichia coli and his antibiotics were changed to Primaxin and vancomycin. His acute encephalopathy was multifactorial secondary to sepsis,? Ongoing seizures/postictal state in the context of intellectual disability. He could not be fed safely orally secondary to somnolence. Patient had  ongoing twitching of left angle of the mouth and left upper extremity. Neurology was consulted. He was loaded with Keppra and continued on maintenance dose IV Keppra. Panda tube was placed and patient was started on home doses of primidone and Tegretol yesterday. His twitching and involuntary movement seem to decrease. EEG was abnormal. He got more dyspneic yesterday and chest x-ray was suggestive of pulmonary edema. He was diuresed with Lasix. He was continued on his C Pap overnight. Patient's mental status was better yesterday. However since this morning, patient's mental status was again worse and he was more lethargic and barely arousable. Continued to have respiratory distress. Neurology reduced dose of his Keppra. Metoprolol and Cardizem were reinitiated through tube for tachycardia. This afternoon, patient abruptly developed bradycardia down to the 30s and then rapidly went into asystolic rhythm. Nurse's alerted rapid response and paged M.D. When nurses went into patient's room, they saw pink tinged white frothy foam draining from patient's mouth. He was DNR. Soon after patient demised and he was pronounced dead at 2:29 PM. Family was notified by nurses. By the time M.D. arrived at patient's bedside, patient had already demised. Subsequently discussed at length with patient's brother and sister and updated entire hospital course and informed that that he had been critically ill and he had severe sepsis from UTI, pneumonia complicated by seizures and acute on chronic diastolic CHF. This M.D. had discussed with patient's sister yesterday and had advised her of his critical condition too. They were appreciative of the update  Time: 30 minutes.  SignedMarcellus Scott:  Raistlin Gum  [facility] 09/12/2014, 3:29 PM

## 2014-10-09 NOTE — Care Management Note (Signed)
    Page 1 of 1   09/24/2014     2:39:43 PM CARE MANAGEMENT NOTE 09/29/2014  Patient:  Tony Terry,Tony Terry   Account Number:  [id number]  Date Initiated:  09/28/2014  Documentation initiated by:  Letha CapeAYLOR,Keerthi Hazell  Subjective/Objective Assessment:   dx sepsis, fever, chf, osa  [facility]     Action/Plan:   Anticipated DC Date:  09/08/2014   Anticipated DC Plan:  SKILLED NURSING FACILITY  In-house referral  Clinical Social Worker      DC Planning Services  CM consult      Choice offered to / List presented to:             Status of service:  Completed, signed off Medicare Important Message given?  YES (If response is "NO", the following Medicare IM given date fields will be blank) Date Medicare IM given:  09/29/2014 Medicare IM given by:  Letha CapeAYLOR,Bubber Rothert Date Additional Medicare IM given:   Additional Medicare IM given by:    Discharge Disposition:  EXPIRED  Per UR Regulation:  Reviewed for med. necessity/level of care/duration of stay  If discussed at Long Length of Stay Meetings, dates discussed:    Comments:  09/24/2014 1438 Letha Capeeborah Jamail Cullers RN, BSN [phone] patient expired.  09/29/14 1117 Letha Capeeborah Crescent Gotham RN, BSN [phone] patient with gram neg rods in blood cx, speech therapist will see pt today.  NCM will cont to follow for dc needs.

## 2014-10-09 NOTE — Progress Notes (Signed)
CRITICAL VALUE ALERT  Critical value received:  E. Coli and ESBL found in urine cultures  Date of notification:  09/18/2014  Time of notification:  0425  Critical value read back:Yes.    Nurse who received alert:  Earlie LouValorie Conley, RN   MD notified (1st page):  Craige CottaKirby, NP  Time of first page:  [phone]  Responding MD:    Time MD responded:

## 2014-10-09 NOTE — Progress Notes (Signed)
1424 received a call from tele, pt. Was brady down in the 30's. Walked into patient's room to see him with pink tinged white frothy foam draining from patients mouth, pt.  Suctioned and repositioned up in bed. Rapid  Response called and checked on patient, patient was unresponsive, then no heartbeat found  At 1429., pt asytole.Family notified to come into hospital. MD at bedside.

## 2014-10-09 NOTE — Progress Notes (Addendum)
Subjective: left facial twitching has improved, but he is more lethargic.   Exam: Filed Vitals:   08-30-14 0551  BP: 126/64  Pulse:   Temp: 99.4 F (37.4 C)  Resp: 19   Gen: In bed, NAD MS: lethargic, tracks, does not speak. Follows commands only followign aggressive noxious stimulation wiggle toes, squeeze hands.  MV:HQIONCN:PERRL, no left facial twitching.  Motor: follows commands to wiggle toes bilaterally, but only with aggressive nox stim   Impression: 59 yo M With recurrent focal left sided motor seizures. He is on keppra as he was not able to take his PO meds yesterday. Now that he has been restarted on his home meds, will decrease keppra as this could be contributing to his lethargy.   Recommendations: 1)decrease keppra to 500mg  BID 2) restart home tegretol and primidone. May need NG tube if unable to swallow.  3) will continue to follow.     Ritta SlotMcNeill Kirkpatrick, MD [facility] [phone]  If 7pm- 7am, please page neurology on call as listed in [facility].

## 2014-10-09 NOTE — Progress Notes (Signed)
59yo male on vanc and Zosyn for sepsis/UTI now showing E.coli and ESBL, to transition from Zosyn to Primaxin.  Will start Primaxin 500mg  IV Q6H for wt >100kg and CrCl >13700ml/min and continue to monitor.  Vernard GamblesVeronda Kadeja Granada, PharmD, BCPS 09/23/2014 4:57 AM

## 2014-10-09 DEATH — deceased

## 2015-11-12 IMAGING — CR DG TOE 5TH 2+V*L*
3 series · 3 of 3 positions shown · non-contrast
Comparison: None.

CLINICAL DATA: Laceration on plantar aspect of 5th digit of left
foot; pt in sleep, not able to answer to question; per EMT's note pt
got laceration on his left 5th toe while he was being transferred to
a wheelchair today;

EXAM:
DG TOE 5TH LEFT

[x toes lat left]
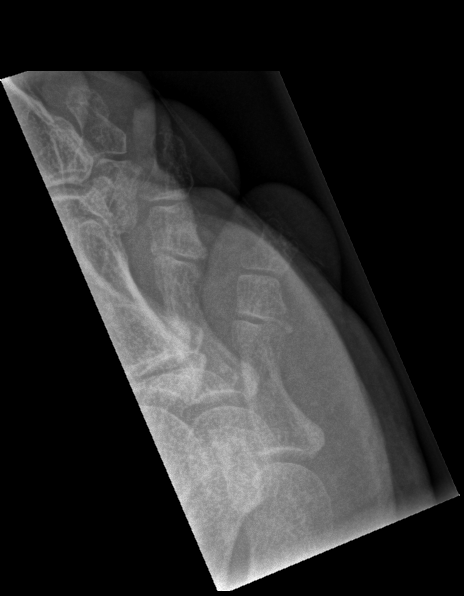

[x toes obl left (1 of 2)]
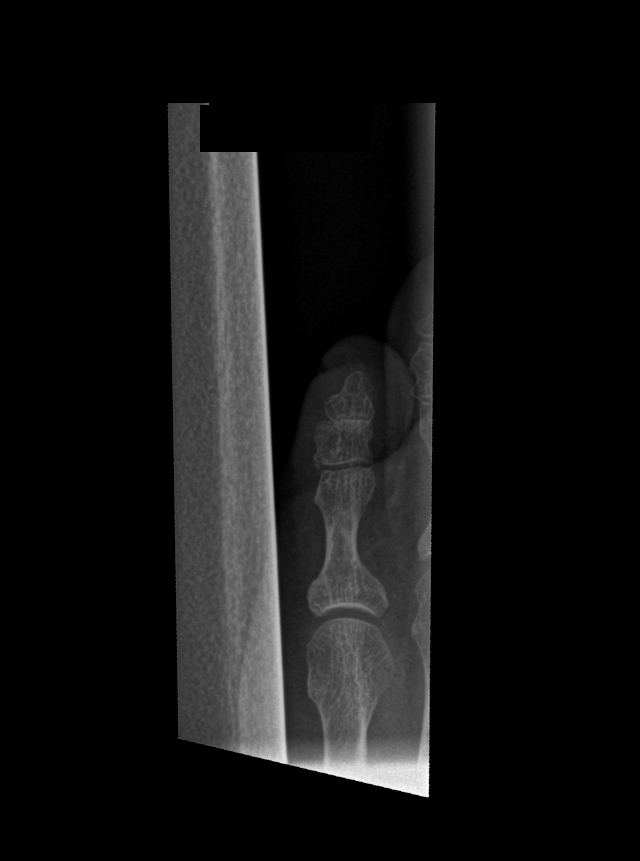

[x toes obl left (2 of 2)]
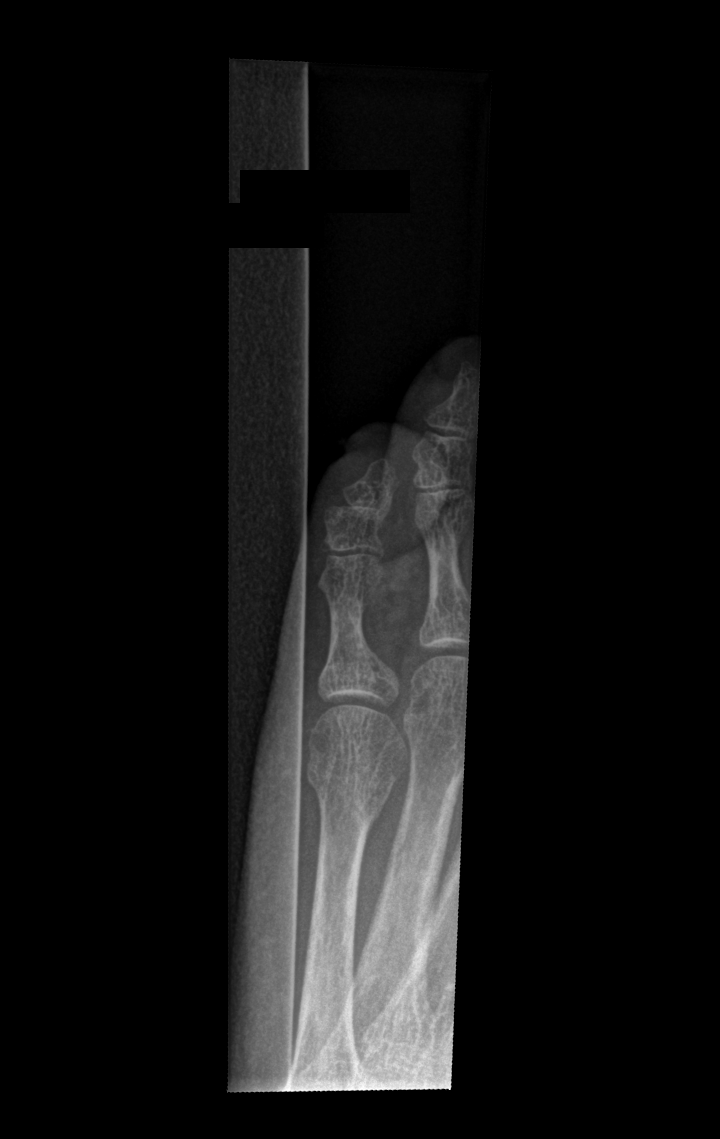

[3 of 3 positions shown; findings below may reference images not displayed]

FINDINGS: 2 mm corner fracture at the lateral base of the middle phalanx,
minimally displaced. No other acute bone abnormality. Normal
mineralization and alignment. Regional soft tissues unremarkable. No
radiodense foreign body.
IMPRESSION: 1. Minimally displaced fracture, lateral base middle phalanx left
little toe.

## 2015-11-19 IMAGING — CR DG CHEST 1V PORT
1 series · 1 of 1 positions shown · non-contrast
Comparison: 09/13/2014

CLINICAL DATA: Altered mental status. Patient was unable to follow
breathing instructions. Cough since this morning.

EXAM:
PORTABLE CHEST - 1 VIEW

[AP]
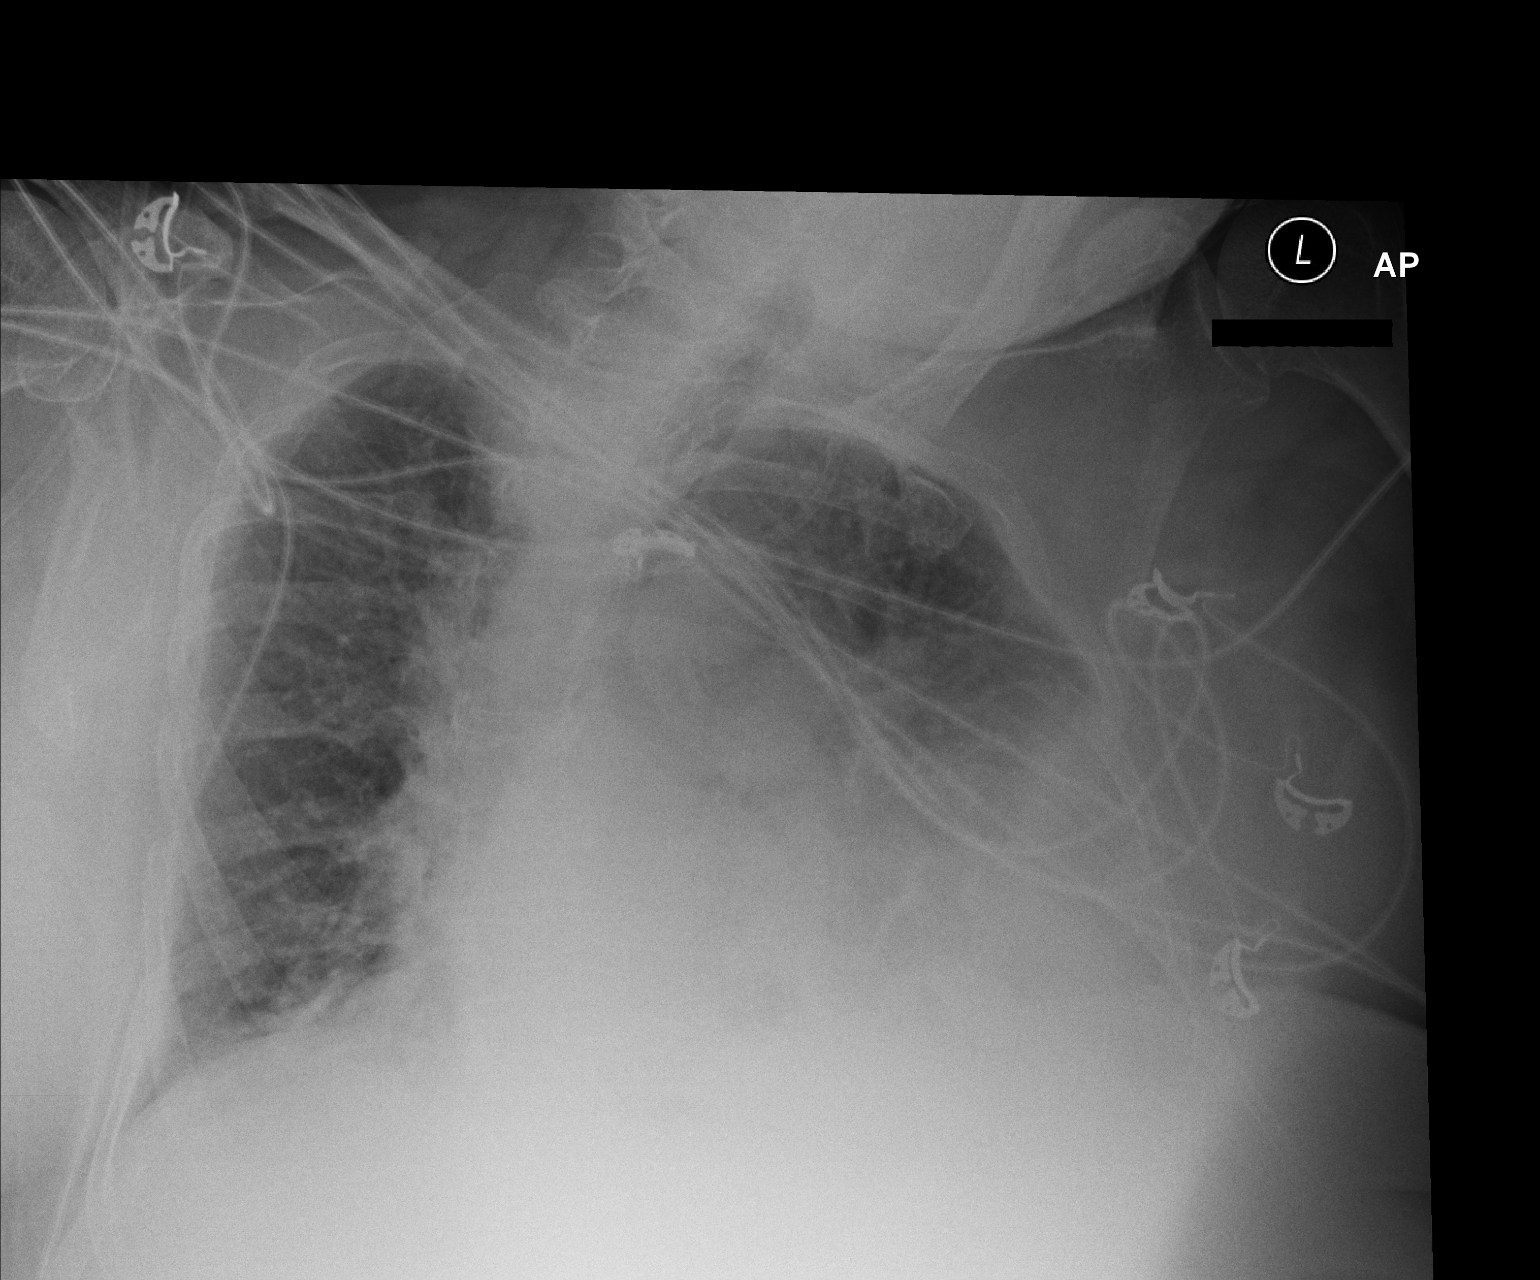

[1 of 1 positions shown; findings below may reference images not displayed]

FINDINGS: Patient is rotated towards the left. Densities at the left lung base
are concerning for volume loss or airspace disease. Overall, low
lung volumes. No focal disease in the right lung. Limited evaluation
of the cardiac silhouette. Negative for a large pneumothorax.
IMPRESSION: Concern for densities in the left lower lung. Differential diagnosis
includes atelectasis versus airspace disease. Limited exam due to
patient positioning.

## 2015-11-22 IMAGING — CR DG CHEST 1V PORT
1 series · 1 of 1 positions shown · non-contrast
Comparison: 09/29/2014

CLINICAL DATA: Congestive heart failure

EXAM:
PORTABLE CHEST - 1 VIEW

[AP]
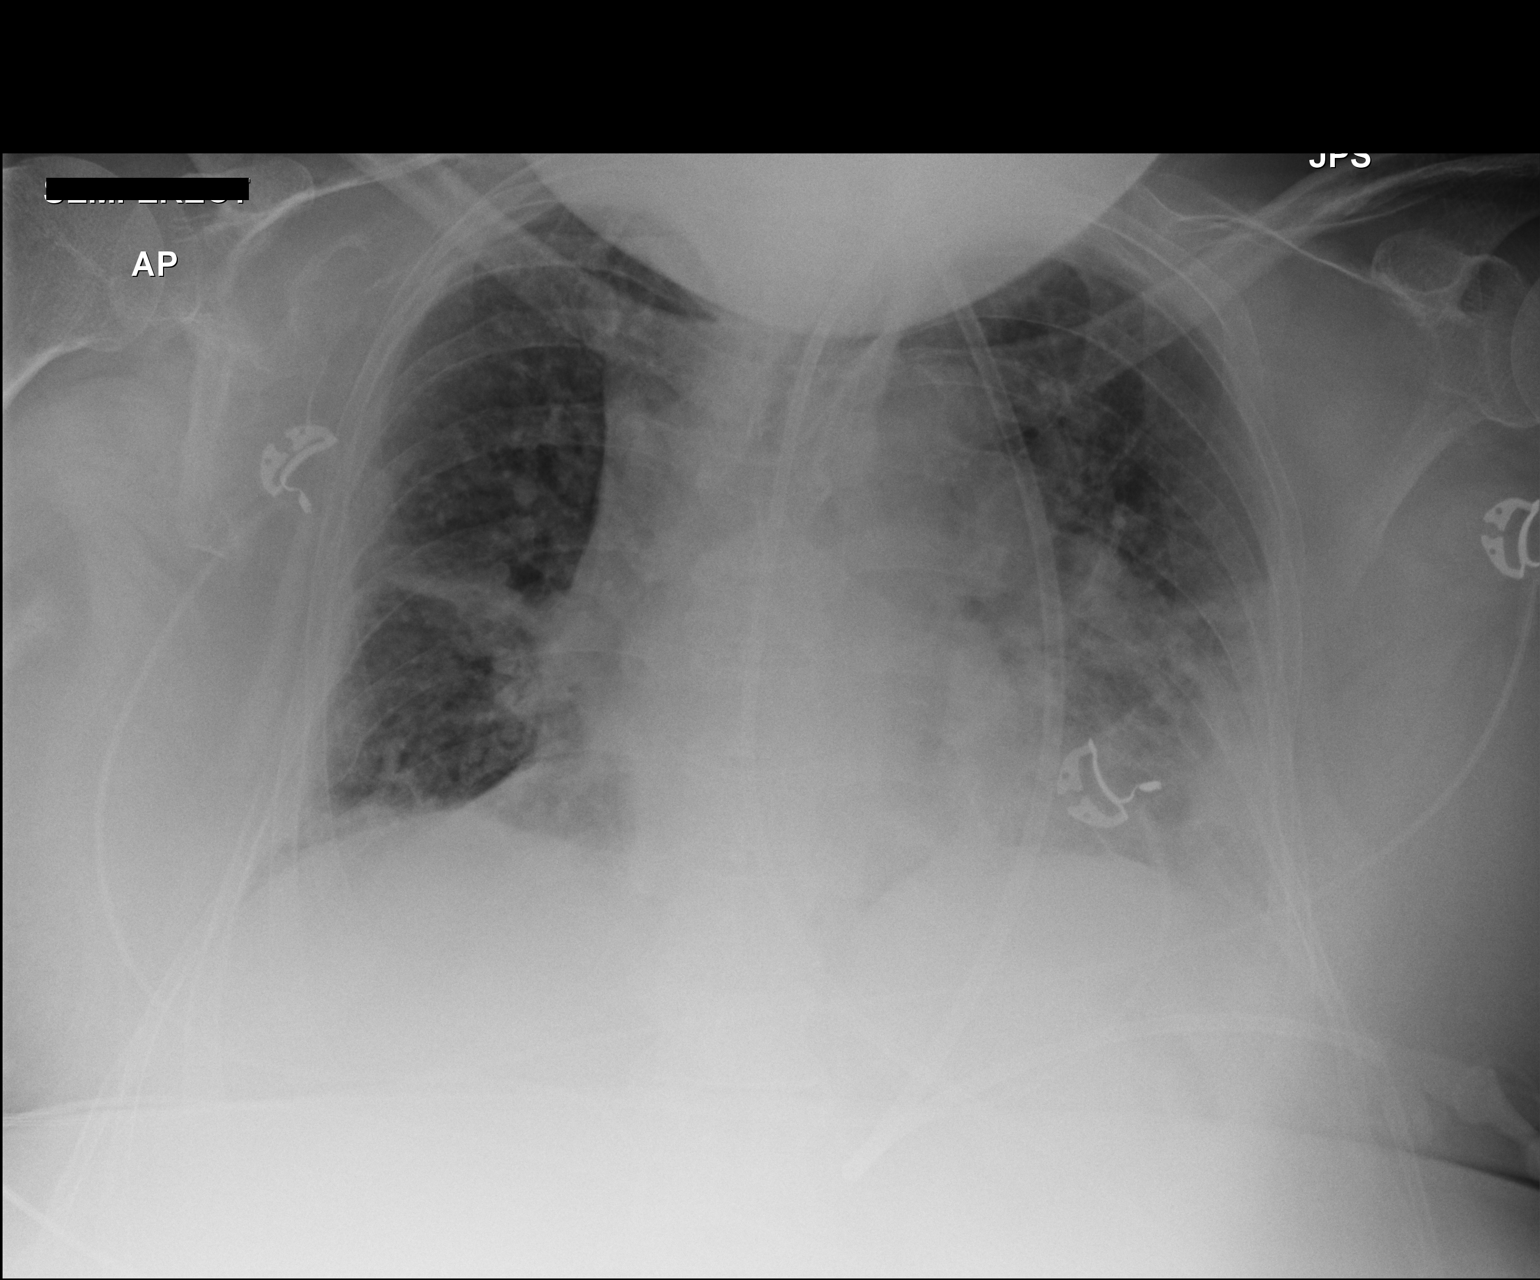

[1 of 1 positions shown; findings below may reference images not displayed]

FINDINGS: Exam detail is diminished due to AP portable technique and patient's
body habitus. There is a feeding tube with tip below the GE
junction. The lung volumes appear low. The heart size is enlarged.
Right midlung atelectasis and left lower lobe opacification is
unchanged from previous exam. Left pleural effusion is suspected [REDACTED] be slightly smaller from previous exam. Continued pulmonary
vascular congestion.
IMPRESSION: 1. Overall, no significant change compared with previous exam.
